# Patient Record
Sex: Male | Born: 1965 | Race: Black or African American | Hispanic: No | Marital: Single | Smoking: Current every day smoker
Health system: Southern US, Community
[De-identification: ages and names within clinical notes are randomized; demographics above are authoritative.]

## PROBLEM LIST (undated history)

## (undated) DIAGNOSIS — Z720 Tobacco use: Secondary | ICD-10-CM

## (undated) HISTORY — PX: LAPAROSCOPIC GASTROTOMY W/ REPAIR OF ULCER: SUR772

---

## 2005-05-07 ENCOUNTER — Emergency Department: Payer: Self-pay | Admitting: Emergency Medicine

## 2011-10-17 ENCOUNTER — Emergency Department: Payer: Self-pay | Admitting: Emergency Medicine

## 2012-05-25 ENCOUNTER — Emergency Department: Payer: Self-pay | Admitting: Emergency Medicine

## 2013-04-01 ENCOUNTER — Emergency Department: Payer: Self-pay | Admitting: Internal Medicine

## 2013-04-01 LAB — BASIC METABOLIC PANEL
Anion Gap: 4 — ABNORMAL LOW (ref 7–16)
BUN: 5 mg/dL — ABNORMAL LOW (ref 7–18)
Calcium, Total: 9.1 mg/dL (ref 8.5–10.1)
Chloride: 102 mmol/L (ref 98–107)
Co2: 28 mmol/L (ref 21–32)
EGFR (Non-African Amer.): >60
Glucose: 136 mg/dL — ABNORMAL HIGH (ref 65–99)
Sodium: 134 mmol/L — ABNORMAL LOW (ref 136–145)

## 2013-04-01 LAB — DRUG SCREEN, URINE
Barbiturates, Ur Screen: NEGATIVE (ref ?–200)
Benzodiazepine, Ur Scrn: NEGATIVE (ref ?–200)
Cannabinoid 50 Ng, Ur ~~LOC~~: NEGATIVE (ref ?–50)
Cocaine Metabolite,Ur ~~LOC~~: NEGATIVE (ref ?–300)
Methadone, Ur Screen: NEGATIVE (ref ?–300)
Phencyclidine (PCP) Ur S: NEGATIVE (ref ?–25)
Tricyclic, Ur Screen: NEGATIVE (ref ?–1000)

## 2013-04-01 LAB — CBC
HGB: 12.7 g/dL — ABNORMAL LOW (ref 13.0–18.0)
MCH: 22.8 pg — ABNORMAL LOW (ref 26.0–34.0)
RBC: 5.55 10*6/uL (ref 4.40–5.90)
RDW: 14.8 % — ABNORMAL HIGH (ref 11.5–14.5)
WBC: 5.9 10*3/uL (ref 3.8–10.6)

## 2013-04-01 LAB — CK TOTAL AND CKMB (NOT AT ARMC): CK-MB: 1.5 ng/mL (ref 0.5–3.6)

## 2013-04-01 LAB — TROPONIN I: Troponin-I: <0.02 ng/mL

## 2014-12-31 ENCOUNTER — Emergency Department: Payer: Self-pay | Admitting: Emergency Medicine

## 2014-12-31 LAB — BASIC METABOLIC PANEL
Anion Gap: 9 (ref 7–16)
BUN: 5 mg/dL — ABNORMAL LOW (ref 7–18)
CHLORIDE: 99 mmol/L (ref 98–107)
CO2: 26 mmol/L (ref 21–32)
Calcium, Total: 9.1 mg/dL (ref 8.5–10.1)
Creatinine: 0.69 mg/dL (ref 0.60–1.30)
EGFR (African American): >60
EGFR (Non-African Amer.): >60
Glucose: 104 mg/dL — ABNORMAL HIGH (ref 65–99)
OSMOLALITY: 266 (ref 275–301)
Potassium: 3.6 mmol/L (ref 3.5–5.1)
Sodium: 134 mmol/L — ABNORMAL LOW (ref 136–145)

## 2014-12-31 LAB — CBC
HCT: 36.3 % — ABNORMAL LOW (ref 40.0–52.0)
HGB: 11.4 g/dL — AB (ref 13.0–18.0)
MCH: 22.5 pg — AB (ref 26.0–34.0)
MCHC: 31.3 g/dL — AB (ref 32.0–36.0)
MCV: 72 fL — AB (ref 80–100)
Platelet: 235 10*3/uL (ref 150–440)
RBC: 5.04 10*6/uL (ref 4.40–5.90)
RDW: 14.8 % — ABNORMAL HIGH (ref 11.5–14.5)
WBC: 16.3 10*3/uL — ABNORMAL HIGH (ref 3.8–10.6)

## 2015-01-06 LAB — WOUND CULTURE

## 2015-01-17 ENCOUNTER — Emergency Department: Payer: Self-pay | Admitting: Emergency Medicine

## 2015-04-16 NOTE — Consult Note (Signed)
PATIENT NAME:  Martin Williams, Martin Williams MR#:  [id number] DATE OF BIRTH:  12-03-1966  DATE OF CONSULTATION:  12/31/2014  REFERRING PHYSICIAN:   CONSULTING PHYSICIAN:  Tenise Stetler A. Thedore Mins, MD  HISTORY OF PRESENT ILLNESS: Martin Williams is a very pleasant 49 year old gentleman who presented to the Emergency Room today for increasing discomfort in his left hemiscrotum. The patient reports, beginning 4 days ago, he noticed that his left hemiscrotum was increasing in size and was uncomfortable. One day ago, he noticed drainage occurring from his hemiscrotum that was cloudy in nature. This is associated with some increased discomfort. He denies fevers or chills. He denies any urinary symptoms, such as urinary frequency, urgency, or dysuria, now or in the preceding weeks. Because his drainage was getting worse this morning, associated with some mild blood tinge, he presented to the Emergency Room. In the Emergency Room, the patient was worked up with labs that showed leukocytosis of 16, a normal BMP, and he had an ultrasound performed of his scrotum, which showed an extratesticular fluid collection, as well as a separate fluid collection surrounding the left testicle. The patient denies at first any previous history of issues with his left testicle. On further questioning, he reports there may have been an episode one year ago, when he had a "boil," which he said resolved without intervention. He denies any recent trauma or changes in his medical health.   PAST MEDICAL HISTORY: None.  PAST SURGICAL HISTORY:  None.  SOCIAL HISTORY: The patient smokes 1/2 pack to a pack per day for the last 20 years, 3 drinks per day.  The patient is single, was brought in by his aunt.    FAMILY HISTORY:  denies cancer  REVIEW OF SYSTEMS:  CONSTITUTIONAL: Denies fevers, chills, fatigue, or weakness.  EYES: Denies blurred vision, double vision, or eye pain.  EARS, NOSE, THROAT: Denies tinnitus, ear pain, or hearing loss. RESPIRATORY: Denies  cough or wheeze.  CARDIOVASCULAR:  Denies any palpitations or edema. GASTROINTESTINAL:  Denies any vomiting or diarrhea.  GENITOURINARY: Denies dysuria or hematuria.  ENDOCRINE: Denies nocturia or thyroid problems.  HEMATOLOGIC AND LYMPHATIC: Denies easy bruising or bleeding MUSCULOSKELETAL: Denies pain in neck, back, shoulder, knees, hips, or arthritic   PHYSICAL EXAMINATION: VITAL SIGNS: Afebrile at 97.9 degrees, slightly tachycardic at 114, blood pressure 158/81, respirations 20 times per minute, saturating 95% on room air.  GENERAL:  No acute distress.  CHEST: Normal respiratory effort. ABDOMEN: Soft, nontender, nondistended.  BACK: No CVA tenderness.  GENITOURINARY: The patient is circumcised, glans is normal with a normal-appearing meatus. The scrotum shows tense swelling of the left hemiscrotum with induration of the inferior aspect of the scrotum. There is watery cloudy drainage from the left hemiscrotum that appears to be coming from a pinpoint location within the center of the indurated skin. There is significant tenderness to palpation of the entire hemiscrotum. The left testicle is difficult to palpate secondary to the swollen hemiscrotum and the tenderness to palpation. The right testicle can be felt within the right hemiscrotum, which has an overall normal appearance. There is a small divot in the skin of the right hemiscrotum, which the patient reports is old and is not sure of the etiology of. Perineum: The perineum is normal without tenderness. Inguinal lymphadenopathy is present on the left side and mildly on the right side. Digital rectal exam was performed. This revealed a nontender prostate measuring approximately 35 grams, without nodularity.  EXTREMITIES: Lower extremities: No edema, bilaterally equal in size.  LABORATORY DATA: I personally reviewed the patient's lab work, which was significant for leukocytosis of 16 and a BMP, which was normal.   I personally reviewed the  patient's ultrasound, which showed normal-appearing testicles. The left testicle had 2 cystic structures adjacent to it, one in the area near the left epididymis, the second surrounding the left inferior aspect of the testicle. Both of these structures had mild debris present, but neither seemed significantly complex. It is possible that this is one loculated structure. There is normal flow to both testicles. There is thickness of the scrotal skin seen on ultrasound.   PROCEDURE: The patient was counseled on the risks and benefits of incision of scrotum to drain possible abscess. Please see seperate procedure note   ASSESSMENT: Mr. Martin Williams is a 49 year old gentleman with 4 days of left hemiscrotal discomfort associated with drainage,  representing a scrotal abscess. Ultrasound was not impressive for a classic-appearing scrotal abscess. His physical exam with indurated skin and active drainage and significant tenderness to palpation suggested this; therefore, decision was made to perform an I and D at the bedside. This was done without difficulty. I counseled the patient that at this point it is crucial he have his wound packed at least twice a day and take a course of oral antibiotics, and that we follow up with him in the near future to ensure he is improving. He reports, after the procedure is done, he feels better. I counseled him, there is the potential that his infection will not improve, and there could be a need for a further procedure in the operating room. The patient reports he has an aunt who can perform the packing for him. Therefore, the aunt was instructed on how to pack the wound at the bedside today.  PLAN: We will plan to send the patient home as he appears non toxic and tolerated procedure well. He will go home with packing materials, as well as a prescription for Bactrim double strength to be taken for the next 3 weeks. As he has no insurance he elects for his aunt to perform packing for  him at home (as opposed to arraning home health). We will plan to send him for follow-up within a week to ensure his wound is improving and to follow up on wound cultures and urine culture obtained today. He was instructed to return to the Emergency Room if he were to begin to experience fevers or chills or increased drainage from the hemiscrotum.    ____________________________ Driscilla GrammesAbhay A. Thedore MinsSingh, MD aas:mw D: 12/31/2014 16:42:00 ET T: 12/31/2014 16:53:21 ET JOB#: [id number]  cc: Martin Williams A. Thedore MinsSingh, MD, <Dictator> Angelica PouABHAY A Mahala Rommel MD ELECTRONICALLY SIGNED 01/01/2015 18:07

## 2021-02-20 ENCOUNTER — Emergency Department (HOSPITAL_COMMUNITY)
Admission: EM | Admit: 2021-02-20 | Discharge: 2021-02-20 | Disposition: A | Discharge status: Home / Self Care | Payer: Self-pay | Attending: Emergency Medicine | Admitting: Emergency Medicine

## 2021-02-20 ENCOUNTER — Other Ambulatory Visit: Payer: Self-pay

## 2021-02-20 ENCOUNTER — Encounter (HOSPITAL_COMMUNITY): Payer: Self-pay

## 2021-02-20 ENCOUNTER — Emergency Department (HOSPITAL_COMMUNITY): Payer: Self-pay

## 2021-02-20 DIAGNOSIS — Y939 Activity, unspecified: Secondary | ICD-10-CM | POA: Insufficient documentation

## 2021-02-20 DIAGNOSIS — M7022 Olecranon bursitis, left elbow: Secondary | ICD-10-CM | POA: Insufficient documentation

## 2021-02-20 DIAGNOSIS — X58XXXA Exposure to other specified factors, initial encounter: Secondary | ICD-10-CM | POA: Insufficient documentation

## 2021-02-20 MED ORDER — NAPROXEN 250 MG PO TABS: 500.0000 mg | ORAL_TABLET | Freq: Once | ORAL | Status: DC

## 2021-02-20 MED ORDER — NAPROXEN 500 MG PO TABS: 500.0000 mg | ORAL_TABLET | Freq: Two times a day (BID) | ORAL | 0 refills | Status: AC

## 2021-02-20 NOTE — ED Triage Notes (Addendum)
Pt here today due to left elbow pain. Pt reports swelling to just elbow starting yesterday. No trauma.

## 2021-02-20 NOTE — Discharge Instructions (Signed)
Recommend following up with orthopedics next week.  Recommend taking anti-inflammatory as discussed for the next week.  If at any point you develop pain with range of motion, redness, fever, or other new concerning symptom, return to ER for reassessment.  Recommend resting, icing and protecting your elbow.

## 2021-02-20 NOTE — ED Provider Notes (Signed)
[facility] EMERGENCY DEPARTMENT Provider Note   CSN: [id number] Arrival date & time: 02/20/21  1016     History Chief Complaint  Patient presents with  . Arm Pain    Martin Williams is a 55 y.o. male.  Presents ER with concern for 1 day of elbow swelling.  Noted swelling on the tip of his left elbow.  Has some mild discomfort.  Has not noted any rashes, no fevers.  No trauma.  He does not work on his hands, no repetitive movements, no prior injuries.  Denies any medical problems.  HPI     History reviewed. No pertinent past medical history.  There are no problems to display for this patient.   History reviewed. No pertinent surgical history.     History reviewed. No pertinent family history.  Social History   Tobacco Use  . Smoking status: Never Smoker  . Smokeless tobacco: Never Used    Home Medications Prior to Admission medications   Medication Sig Start Date End Date Taking? Authorizing Provider  naproxen (NAPROSYN) 500 MG tablet Take 1 tablet (500 mg total) by mouth 2 (two) times daily for 7 days. 02/20/21 02/27/21 Yes Milagros Loll, MD    Allergies    Patient has no allergy information on record.  Review of Systems   Review of Systems  Constitutional: Negative for chills and fever.  HENT: Negative for ear pain and sore throat.   Eyes: Negative for pain and visual disturbance.  Respiratory: Negative for cough and shortness of breath.   Cardiovascular: Negative for chest pain and palpitations.  Gastrointestinal: Negative for abdominal pain and vomiting.  Genitourinary: Negative for dysuria and hematuria.  Musculoskeletal: Positive for arthralgias. Negative for back pain.  Skin: Negative for color change and rash.  Neurological: Negative for seizures and syncope.  All other systems reviewed and are negative.    Physical Exam Updated Vital Signs BP (!) 166/93 (BP Location: Right Arm)   Pulse 72   Temp 97.8 F (36.6 C)   Resp 18    SpO2 100%   Physical Exam Vitals and nursing note reviewed.  Constitutional:      Appearance: He is well-developed and well-nourished.  HENT:     Head: Normocephalic and atraumatic.  Eyes:     Conjunctiva/sclera: Conjunctivae normal.  Cardiovascular:     Rate and Rhythm: Normal rate.     Pulses: Normal pulses.  Pulmonary:     Effort: Pulmonary effort is normal. No respiratory distress.  Abdominal:     Palpations: Abdomen is soft.     Tenderness: There is no abdominal tenderness.  Musculoskeletal:        General: No edema.     Cervical back: Neck supple.     Comments: L elbow: swelling over the olecranon, no overlying erythema, normal joint ROM, no TTP  Skin:    General: Skin is warm and dry.  Neurological:     General: No focal deficit present.     Mental Status: He is alert.  Psychiatric:        Mood and Affect: Mood and affect normal.     ED Results / Procedures / Treatments   Labs (all labs ordered are listed, but only abnormal results are displayed) Labs Reviewed - No data to display  EKG None  Radiology DG Elbow Complete Left  Result Date: 02/20/2021 CLINICAL DATA:  Pain and swelling EXAM: LEFT ELBOW - COMPLETE 3+ VIEW COMPARISON:  None. FINDINGS: Frontal, lateral, and  bilateral oblique views were obtained. There is no fracture or dislocation. No appreciable elbow joint effusion. There is marked soft tissue swelling in the region of the olecranon bursa. There is a prominent olecranon process spur. No appreciable joint space narrowing or erosion. IMPRESSION: Marked soft tissue fullness/swelling in the olecranon bursa region. Suspect bursitis. There may well be fluid in this structure given the appearance. There is a prominent olecranon process spur. No fracture or dislocation. No appreciable joint space narrowing or joint effusion. No erosion. Electronically Signed   By: Bretta Bang III M.D.   On: 02/20/2021 11:09    Procedures Procedures =  Medications  Ordered in ED Medications - No data to display  ED Course  I have reviewed the triage vital signs and the nursing notes.  Pertinent labs & imaging results that were available during my care of the patient were reviewed by me and considered in my medical decision making (see chart for details).    MDM Rules/Calculators/A&P                          55 year old male presenting to ER with concern for left elbow pain and swelling.  On exam, consistent with olecranon bursitis.  He has no erythema, completely normal joint range of motion, relatively minimal pain, no fever.  Very low suspicion for septic bursitis at this time.  Recommend trial of conservative management with NSAIDs, rest and follow-up with Ortho.  Reviewed return precautions.  Final Clinical Impression(s) / ED Diagnoses Final diagnoses:  Olecranon bursitis of left elbow    Rx / DC Orders ED Discharge Orders         Ordered    naproxen (NAPROSYN) 500 MG tablet  2 times daily        02/20/21 1117           Milagros Loll, MD 02/21/21 [phone]

## 2021-03-10 ENCOUNTER — Other Ambulatory Visit: Payer: Self-pay

## 2021-03-10 ENCOUNTER — Emergency Department (HOSPITAL_COMMUNITY): Payer: Self-pay

## 2021-03-10 ENCOUNTER — Inpatient Hospital Stay (HOSPITAL_COMMUNITY)
Admission: EM | Admit: 2021-03-10 | Discharge: 2021-03-16 | DRG: 481 | Disposition: A | Discharge status: Home Health | Payer: Self-pay | Attending: Family Medicine | Admitting: Family Medicine

## 2021-03-10 DIAGNOSIS — E871 Hypo-osmolality and hyponatremia: Secondary | ICD-10-CM | POA: Diagnosis present

## 2021-03-10 DIAGNOSIS — S7291XA Unspecified fracture of right femur, initial encounter for closed fracture: Secondary | ICD-10-CM

## 2021-03-10 DIAGNOSIS — W010XXA Fall on same level from slipping, tripping and stumbling without subsequent striking against object, initial encounter: Secondary | ICD-10-CM | POA: Diagnosis present

## 2021-03-10 DIAGNOSIS — F17210 Nicotine dependence, cigarettes, uncomplicated: Secondary | ICD-10-CM | POA: Diagnosis present

## 2021-03-10 DIAGNOSIS — W19XXXA Unspecified fall, initial encounter: Secondary | ICD-10-CM

## 2021-03-10 DIAGNOSIS — D696 Thrombocytopenia, unspecified: Secondary | ICD-10-CM | POA: Diagnosis not present

## 2021-03-10 DIAGNOSIS — S72009A Fracture of unspecified part of neck of unspecified femur, initial encounter for closed fracture: Secondary | ICD-10-CM

## 2021-03-10 DIAGNOSIS — S7290XA Unspecified fracture of unspecified femur, initial encounter for closed fracture: Secondary | ICD-10-CM

## 2021-03-10 DIAGNOSIS — E559 Vitamin D deficiency, unspecified: Secondary | ICD-10-CM | POA: Diagnosis present

## 2021-03-10 DIAGNOSIS — D62 Acute posthemorrhagic anemia: Secondary | ICD-10-CM | POA: Diagnosis not present

## 2021-03-10 DIAGNOSIS — M80851A Other osteoporosis with current pathological fracture, right femur, initial encounter for fracture: Principal | ICD-10-CM | POA: Diagnosis present

## 2021-03-10 DIAGNOSIS — R9431 Abnormal electrocardiogram [ECG] [EKG]: Secondary | ICD-10-CM | POA: Diagnosis present

## 2021-03-10 DIAGNOSIS — E876 Hypokalemia: Secondary | ICD-10-CM | POA: Diagnosis not present

## 2021-03-10 DIAGNOSIS — Y92511 Restaurant or cafe as the place of occurrence of the external cause: Secondary | ICD-10-CM

## 2021-03-10 DIAGNOSIS — Z20822 Contact with and (suspected) exposure to covid-19: Secondary | ICD-10-CM | POA: Diagnosis present

## 2021-03-10 DIAGNOSIS — D509 Iron deficiency anemia, unspecified: Secondary | ICD-10-CM | POA: Diagnosis present

## 2021-03-10 DIAGNOSIS — Z23 Encounter for immunization: Secondary | ICD-10-CM

## 2021-03-10 HISTORY — DX: Tobacco use: Z72.0

## 2021-03-10 LAB — CBC WITH DIFFERENTIAL/PLATELET
Abs Immature Granulocytes: 0.04 10*3/uL (ref 0.00–0.07)
Basophils Absolute: 0.1 10*3/uL (ref 0.0–0.1)
Basophils Relative: 1 %
Eosinophils Absolute: 0.1 10*3/uL (ref 0.0–0.5)
Eosinophils Relative: 1 %
HCT: 36.6 % — ABNORMAL LOW (ref 39.0–52.0)
Hemoglobin: 11.5 g/dL — ABNORMAL LOW (ref 13.0–17.0)
Immature Granulocytes: 0 %
Lymphocytes Relative: 11 %
Lymphs Abs: 1.1 10*3/uL (ref 0.7–4.0)
MCH: 22.5 pg — ABNORMAL LOW (ref 26.0–34.0)
MCHC: 31.4 g/dL (ref 30.0–36.0)
MCV: 71.6 fL — ABNORMAL LOW (ref 80.0–100.0)
Monocytes Absolute: 0.8 10*3/uL (ref 0.1–1.0)
Monocytes Relative: 8 %
Neutro Abs: 7.7 10*3/uL (ref 1.7–7.7)
Neutrophils Relative %: 79 %
Platelets: 184 10*3/uL (ref 150–400)
RBC: 5.11 MIL/uL (ref 4.22–5.81)
RDW: 15.8 % — ABNORMAL HIGH (ref 11.5–15.5)
WBC: 9.7 10*3/uL (ref 4.0–10.5)
nRBC: 0 % (ref 0.0–0.2)

## 2021-03-10 LAB — COMPREHENSIVE METABOLIC PANEL
ALT: 15 U/L (ref 0–44)
AST: 34 U/L (ref 15–41)
Albumin: 4.1 g/dL (ref 3.5–5.0)
Alkaline Phosphatase: 61 U/L (ref 38–126)
Anion gap: 6 (ref 5–15)
BUN: 6 mg/dL (ref 6–20)
CO2: 23 mmol/L (ref 22–32)
Calcium: 8.9 mg/dL (ref 8.9–10.3)
Chloride: 102 mmol/L (ref 98–111)
Creatinine, Ser: 0.74 mg/dL (ref 0.61–1.24)
GFR, Estimated: >60 mL/min (ref >60)
Glucose, Bld: 109 mg/dL — ABNORMAL HIGH (ref 70–99)
Potassium: 3.6 mmol/L (ref 3.5–5.1)
Sodium: 131 mmol/L — ABNORMAL LOW (ref 135–145)
Total Bilirubin: 0.4 mg/dL (ref 0.3–1.2)
Total Protein: 7.3 g/dL (ref 6.5–8.1)

## 2021-03-10 MED ORDER — FENTANYL CITRATE (PF) 100 MCG/2ML IJ SOLN
50.0000 ug | Freq: Once | INTRAMUSCULAR | Status: AC
  Administered 2021-03-10: 50 ug via INTRAMUSCULAR
  Filled 2021-03-10: qty 2

## 2021-03-10 MED ORDER — HYDROCODONE-ACETAMINOPHEN 5-325 MG PO TABS: 1.0000 | ORAL_TABLET | Freq: Four times a day (QID) | ORAL | Status: DC | PRN

## 2021-03-10 MED ORDER — TETANUS-DIPHTH-ACELL PERTUSSIS 5-2.5-18.5 LF-MCG/0.5 IM SUSY
0.5000 mL | PREFILLED_SYRINGE | Freq: Once | INTRAMUSCULAR | Status: AC
  Administered 2021-03-10: 0.5 mL via INTRAMUSCULAR
  Filled 2021-03-10: qty 0.5

## 2021-03-10 MED ORDER — POLYETHYLENE GLYCOL 3350 17 G PO PACK: 17.0000 g | PACK | Freq: Two times a day (BID) | ORAL | Status: DC

## 2021-03-10 MED ORDER — ACETAMINOPHEN 325 MG PO TABS
650.0000 mg | ORAL_TABLET | Freq: Three times a day (TID) | ORAL | Status: DC
  Administered 2021-03-11: 650 mg via ORAL
  Filled 2021-03-10: qty 2

## 2021-03-10 MED ORDER — HYDROMORPHONE HCL 1 MG/ML IJ SOLN
1.0000 mg | Freq: Once | INTRAMUSCULAR | Status: AC
Start: 2021-03-10 — End: 2021-03-10
  Administered 2021-03-10: 1 mg via INTRAVENOUS
  Filled 2021-03-10: qty 1

## 2021-03-10 NOTE — ED Triage Notes (Signed)
Brought in by [facility] from work, pt had mechanical fall and has right hip pain and swelling and right shoulder pain and abrasion due to slippery floor.   Denies any use of blood thinners and denies hitting head. GCS15 pain scale 10/10.

## 2021-03-10 NOTE — ED Notes (Signed)
Patient transported to X-ray 

## 2021-03-10 NOTE — ED Notes (Signed)
Spoke with ortho tech and he states pt can get the bucks traction upstairs.

## 2021-03-10 NOTE — Progress Notes (Signed)
Ortho Note  55 yo male with right intertrochanteric femur fracture. Plan for surgery tomorrow AM. Recommend hospitalist admission. NPO after midnight.  Roby Lofts, MD [facility] [phone] (office) [email or web address]

## 2021-03-10 NOTE — H&P (Signed)
[facility] Admission History and Physical Service Pager: [phone]  Patient name: Martin Williams Medical record number: [id number] Date of birth: 1966/05/26 Age: 55 y.o. Gender: male  Primary Care Provider: Patient, No Pcp Per Consultants: ortho Code Status: full Preferred Emergency Contact: Deborah brown  Chief Complaint: R hip fx  Assessment and Plan: [name] is a 55 y.o. male presenting with R hip fx. PMH is significant for none.  R hip fx- s/p mechanical fall. Ortho requested medicine admission. S/p dilaudid and fentanyl in ED. Pt comfortable on encounter. R leg externally rotated. Tender to palpation. swelling present but no skin changes. Good distal circulation/sensation/toe movement. Also had shoulder/elbow injury with fall which was negative for fx on imaging.  - admit to [facility], attending Dr. Manson Passey - scheduled tylenol - PRN 1-2 oxycodone - NPO midnight - f/u ortho recs  - tentative surgery 3/27 - Tdap given - bucks traction - PT/OT s/p procedure - type & screen - mild microcytic anemia present  - repeat CBC s/p surgery.   - consider anemia studies  Tobacco use-  - nicotine patch daily  Hyponatremia- mild. Na 131 on admission and appears chronic.  - IV fluids x6hrs  QTc prolongation- ?500 on ECG - repeat am  PCP needs- - CCM referral placed  FEN/GI: NPO for surgery Prophylaxis: SCD, lovenox s/p procedure  Disposition: home, per ortho  History of Present Illness:  Martin Williams is a 55 y.o. male presenting with mechanical fall today resulting in R hip fracture. He is overall comfortable at rest now.  Occasional alcohol use. Current smoker. Denies illicit drugs.  Review Of Systems: Per HPI with the following additions:   Review of Systems   Patient Active Problem List   Diagnosis Date Noted  . Hip fracture (HCC) 03/10/2021   Past Medical History: No past medical history on file.  Past Surgical History: No past  surgical history on file.  Social History: Social History   Tobacco Use  . Smoking status: Never Smoker  . Smokeless tobacco: Never Used   Additional social history:   Please also refer to relevant sections of EMR.  Family History: No family history on file.  Allergies and Medications: No Known Allergies No current facility-administered medications on file prior to encounter.   No current outpatient medications on file prior to encounter.    Objective: BP 135/89 (BP Location: Right Arm)   Pulse 62   Temp 98 F (36.7 C) (Oral)   Resp 16   Ht 6\' 2"  (1.88 m)   Wt 79.4 kg   SpO2 100%   BMI 22.47 kg/m  Physical Exam Vitals and nursing note reviewed.  Constitutional:      General: He is not in acute distress.    Appearance: He is normal weight. He is not ill-appearing or toxic-appearing.  HENT:     Head: Normocephalic.  Cardiovascular:     Rate and Rhythm: Normal rate and regular rhythm.     Pulses: Normal pulses.     Heart sounds: Normal heart sounds.  Pulmonary:     Effort: Pulmonary effort is normal.     Breath sounds: Normal breath sounds.  Musculoskeletal:     Right hip: Deformity, tenderness and bony tenderness present. No lacerations. Normal range of motion. Decreased strength.     Left hip: Normal.     Right upper leg: Edema, deformity and tenderness present. No lacerations.     Right knee: Normal.  Left knee: Normal.     Right lower leg: Normal.     Left lower leg: Normal.     Right foot: Normal.     Left foot: Normal.  Neurological:     Mental Status: He is alert.      Labs and Imaging: CBC BMET  Recent Labs  Lab 03/10/21 2226  WBC 9.7  HGB 11.5*  HCT 36.6*  PLT 184   Recent Labs  Lab 03/10/21 2226  NA 131*  K 3.6  CL 102  CO2 23  BUN 6  CREATININE 0.74  GLUCOSE 109*  CALCIUM 8.9     DG Chest 1 View  Result Date: 03/10/2021 CLINICAL DATA:  Pain after fall. EXAM: CHEST  1 VIEW COMPARISON:  Chest radiograph April 01, 2013  FINDINGS: The heart size and mediastinal contours are within normal limits. Both lungs are clear. The visualized skeletal structures are unremarkable. IMPRESSION: No acute cardiopulmonary disease. Electronically Signed   By: Maudry Mayhew MD   On: 03/10/2021 22:01   DG Shoulder Right  Result Date: 03/10/2021 CLINICAL DATA:  Shoulder pain after fall EXAM: RIGHT SHOULDER - 2+ VIEW COMPARISON:  None. FINDINGS: There is no evidence of fracture or dislocation. There is no evidence of arthropathy or other focal bone abnormality. Soft tissues are unremarkable. IMPRESSION: No acute osseous abnormality. Electronically Signed   By: Maudry Mayhew MD   On: 03/10/2021 22:01   DG Hip Unilat W or Wo Pelvis 2-3 Views Right  Result Date: 03/10/2021 CLINICAL DATA:  Mechanical fall with right hip pain and swelling. EXAM: DG HIP (WITH OR WITHOUT PELVIS) 2-3V RIGHT COMPARISON:  None. FINDINGS: Displaced and impacted right inter trochanteric fracture. The femoral head appears seated well in the acetabulum. Soft tissue swelling about the hip. IMPRESSION: Displaced and impacted right intertrochanteric hip fracture. Electronically Signed   By: Maudry Mayhew MD   On: 03/10/2021 22:00    EKG: overall normal. QTc 500.  Leeroy Bock, DO 03/11/2021, 12:08 AM PGY-3, [place] FPTS Intern pager: [phone], text pages welcome

## 2021-03-10 NOTE — ED Provider Notes (Signed)
[facility] EMERGENCY DEPARTMENT Provider Note   CSN: [id number] Arrival date & time: 03/10/21  2046     History Chief Complaint  Patient presents with  . Hip Pain  . Fall    Martin Williams is a 55 y.o. male.  HPI      55yo male with no significant medical history presents with concern for right hip pain after a fall.  Works at [employer] and floor was just mopped and he was trying to tiptoe carefully across the floor and got to gate and slipped and fell landing on his right hip. Has hip pain and right shoulder pain.  No head trauma, LOC, neck pain, back pain, numbness/weakness.  No chest pain, dyspnea.  Pain is severe.  No past medical history on file.  There are no problems to display for this patient.   No past surgical history on file.     No family history on file.  Social History   Tobacco Use  . Smoking status: Never Smoker  . Smokeless tobacco: Never Used    Home Medications Prior to Admission medications   Not on File    Allergies    Patient has no known allergies.  Review of Systems   Review of Systems  Constitutional: Negative for fever.  HENT: Negative for sore throat.   Eyes: Negative for visual disturbance.  Respiratory: Negative for cough and shortness of breath.   Cardiovascular: Negative for chest pain.  Gastrointestinal: Negative for abdominal pain, nausea and vomiting.  Genitourinary: Negative for difficulty urinating.  Musculoskeletal: Positive for arthralgias. Negative for back pain, neck pain and neck stiffness.  Skin: Negative for rash.  Neurological: Negative for syncope and headaches.    Physical Exam Updated Vital Signs BP (!) 158/90 (BP Location: Right Arm)   Pulse 64   Temp 98 F (36.7 C) (Oral)   Resp 13   Ht 6\' 2"  (1.88 m)   Wt 79.4 kg   SpO2 98%   BMI 22.47 kg/m   Physical Exam Vitals and nursing note reviewed.  Constitutional:      General: He is not in acute distress.    Appearance: He is  well-developed. He is not diaphoretic.  HENT:     Head: Normocephalic and atraumatic.  Eyes:     Conjunctiva/sclera: Conjunctivae normal.  Cardiovascular:     Rate and Rhythm: Normal rate and regular rhythm.     Heart sounds: Normal heart sounds. No murmur heard. No friction rub. No gallop.   Pulmonary:     Effort: Pulmonary effort is normal. No respiratory distress.     Breath sounds: Normal breath sounds. No wheezing or rales.  Chest:     Chest wall: No tenderness.  Abdominal:     General: There is no distension.     Palpations: Abdomen is soft.     Tenderness: There is no abdominal tenderness. There is no guarding.  Musculoskeletal:     Cervical back: Normal range of motion.     Comments: No C/T/L spine tenderness Shortened, externally rotated right leg Normal sensation, movement, pulses distally Abrasion right posterior shoulder  Skin:    General: Skin is warm and dry.  Neurological:     Mental Status: He is alert and oriented to person, place, and time.     ED Results / Procedures / Treatments   Labs (all labs ordered are listed, but only abnormal results are displayed) Labs Reviewed  CBC WITH DIFFERENTIAL/PLATELET  COMPREHENSIVE METABOLIC PANEL  TYPE AND SCREEN   EKG None  Radiology DG Chest 1 View  Result Date: 03/10/2021 CLINICAL DATA:  Pain after fall. EXAM: CHEST  1 VIEW COMPARISON:  Chest radiograph April 01, 2013 FINDINGS: The heart size and mediastinal contours are within normal limits. Both lungs are clear. The visualized skeletal structures are unremarkable. IMPRESSION: No acute cardiopulmonary disease. Electronically Signed   By: Maudry Mayhew MD   On: 03/10/2021 22:01   DG Shoulder Right  Result Date: 03/10/2021 CLINICAL DATA:  Shoulder pain after fall EXAM: RIGHT SHOULDER - 2+ VIEW COMPARISON:  None. FINDINGS: There is no evidence of fracture or dislocation. There is no evidence of arthropathy or other focal bone abnormality. Soft tissues are  unremarkable. IMPRESSION: No acute osseous abnormality. Electronically Signed   By: Maudry Mayhew MD   On: 03/10/2021 22:01   DG Hip Unilat W or Wo Pelvis 2-3 Views Right  Result Date: 03/10/2021 CLINICAL DATA:  Mechanical fall with right hip pain and swelling. EXAM: DG HIP (WITH OR WITHOUT PELVIS) 2-3V RIGHT COMPARISON:  None. FINDINGS: Displaced and impacted right inter trochanteric fracture. The femoral head appears seated well in the acetabulum. Soft tissue swelling about the hip. IMPRESSION: Displaced and impacted right intertrochanteric hip fracture. Electronically Signed   By: Maudry Mayhew MD   On: 03/10/2021 22:00    Procedures Procedures   Medications Ordered in ED Medications  HYDROmorphone (DILAUDID) injection 1 mg (has no administration in time range)  fentaNYL (SUBLIMAZE) injection 50 mcg (50 mcg Intramuscular Given 03/10/21 2120)    ED Course  I have reviewed the triage vital signs and the nursing notes.  Pertinent labs & imaging results that were available during my care of the patient were reviewed by me and considered in my medical decision making (see chart for details).    MDM Rules/Calculators/A&P                           55yo male with no significant medical history presents with concern for right hip pain after a fall.  Also with shoulder painn, XR WNL. No sign of intracranial, cervical, thoracic, lumbar, chest or abdominal injury by history and exma.  XR shows intertrochanteric hip fracture. NV intact.  Discussed with Dr. Jena Gauss or Orthopedics and admitted for further care.   Final Clinical Impression(s) / ED Diagnoses Final diagnoses:  Hip fx [facility])    Rx / DC Orders ED Discharge Orders    None       Alvira Monday, MD 03/12/21 [phone]

## 2021-03-11 ENCOUNTER — Inpatient Hospital Stay (HOSPITAL_COMMUNITY): Payer: Self-pay | Admitting: Anesthesiology

## 2021-03-11 ENCOUNTER — Inpatient Hospital Stay (HOSPITAL_COMMUNITY): Payer: Self-pay

## 2021-03-11 ENCOUNTER — Encounter (HOSPITAL_COMMUNITY): Payer: Self-pay | Admitting: Student in an Organized Health Care Education/Training Program

## 2021-03-11 ENCOUNTER — Encounter (HOSPITAL_COMMUNITY)
Admission: EM | Disposition: A | Discharge status: Home Health | Payer: Self-pay | Source: Home / Self Care | Attending: Family Medicine

## 2021-03-11 DIAGNOSIS — S72001A Fracture of unspecified part of neck of right femur, initial encounter for closed fracture: Secondary | ICD-10-CM

## 2021-03-11 DIAGNOSIS — W19XXXA Unspecified fall, initial encounter: Secondary | ICD-10-CM

## 2021-03-11 HISTORY — PX: INTRAMEDULLARY (IM) NAIL INTERTROCHANTERIC: SHX5875

## 2021-03-11 LAB — CBC
HCT: 27.5 % — ABNORMAL LOW (ref 39.0–52.0)
Hemoglobin: 8.8 g/dL — ABNORMAL LOW (ref 13.0–17.0)
MCH: 22.9 pg — ABNORMAL LOW (ref 26.0–34.0)
MCHC: 32 g/dL (ref 30.0–36.0)
MCV: 71.6 fL — ABNORMAL LOW (ref 80.0–100.0)
Platelets: 162 10*3/uL (ref 150–400)
RBC: 3.84 MIL/uL — ABNORMAL LOW (ref 4.22–5.81)
RDW: 15.4 % (ref 11.5–15.5)
WBC: 10 10*3/uL (ref 4.0–10.5)
nRBC: 0 % (ref 0.0–0.2)

## 2021-03-11 LAB — BASIC METABOLIC PANEL
Anion gap: 8 (ref 5–15)
BUN: 5 mg/dL — ABNORMAL LOW (ref 6–20)
CO2: 24 mmol/L (ref 22–32)
Calcium: 8.4 mg/dL — ABNORMAL LOW (ref 8.9–10.3)
Chloride: 100 mmol/L (ref 98–111)
Creatinine, Ser: 0.71 mg/dL (ref 0.61–1.24)
GFR, Estimated: >60 mL/min (ref >60)
Glucose, Bld: 114 mg/dL — ABNORMAL HIGH (ref 70–99)
Potassium: 3.5 mmol/L (ref 3.5–5.1)
Sodium: 132 mmol/L — ABNORMAL LOW (ref 135–145)

## 2021-03-11 LAB — SURGICAL PCR SCREEN
MRSA, PCR: NEGATIVE
Staphylococcus aureus: NEGATIVE

## 2021-03-11 LAB — ABO/RH: ABO/RH(D): O POS

## 2021-03-11 LAB — RESP PANEL BY RT-PCR (FLU A&B, COVID) ARPGX2
Influenza A by PCR: NEGATIVE
Influenza B by PCR: NEGATIVE
SARS Coronavirus 2 by RT PCR: NEGATIVE

## 2021-03-11 LAB — VITAMIN D 25 HYDROXY (VIT D DEFICIENCY, FRACTURES): Vit D, 25-Hydroxy: 8.52 ng/mL — ABNORMAL LOW (ref 30–100)

## 2021-03-11 LAB — HIV ANTIBODY (ROUTINE TESTING W REFLEX): HIV Screen 4th Generation wRfx: NONREACTIVE

## 2021-03-11 SURGERY — FIXATION, FRACTURE, INTERTROCHANTERIC, WITH INTRAMEDULLARY ROD
Anesthesia: General | Laterality: Right

## 2021-03-11 MED ORDER — 0.9 % SODIUM CHLORIDE (POUR BTL) OPTIME
TOPICAL | Status: DC | PRN
  Administered 2021-03-11: 500 mL

## 2021-03-11 MED ORDER — PHENYLEPHRINE HCL-NACL 10-0.9 MG/250ML-% IV SOLN
INTRAVENOUS | Status: DC | PRN
  Administered 2021-03-11: 75 ug/min via INTRAVENOUS

## 2021-03-11 MED ORDER — MIDAZOLAM HCL 2 MG/2ML IJ SOLN
INTRAMUSCULAR | Status: AC
  Filled 2021-03-11: qty 2

## 2021-03-11 MED ORDER — FENTANYL CITRATE (PF) 100 MCG/2ML IJ SOLN
25.0000 ug | INTRAMUSCULAR | Status: DC | PRN
Start: 2021-03-11 — End: 2021-03-11
  Administered 2021-03-11: 50 ug via INTRAVENOUS

## 2021-03-11 MED ORDER — FENTANYL CITRATE (PF) 250 MCG/5ML IJ SOLN
INTRAMUSCULAR | Status: DC | PRN
  Administered 2021-03-11 (×3): 50 ug via INTRAVENOUS

## 2021-03-11 MED ORDER — SUGAMMADEX SODIUM 200 MG/2ML IV SOLN
INTRAVENOUS | Status: DC | PRN
  Administered 2021-03-11: 200 mg via INTRAVENOUS
  Administered 2021-03-11: 20 mg via INTRAVENOUS

## 2021-03-11 MED ORDER — ONDANSETRON HCL 4 MG/2ML IJ SOLN: 4.0000 mg | Freq: Once | INTRAMUSCULAR | Status: DC | PRN

## 2021-03-11 MED ORDER — FENTANYL CITRATE (PF) 100 MCG/2ML IJ SOLN
INTRAMUSCULAR | Status: AC
  Filled 2021-03-11: qty 2

## 2021-03-11 MED ORDER — ORAL CARE MOUTH RINSE: 15.0000 mL | Freq: Once | OROMUCOSAL | Status: AC

## 2021-03-11 MED ORDER — POLYETHYLENE GLYCOL 3350 17 G PO PACK
17.0000 g | PACK | Freq: Every day | ORAL | Status: DC | PRN
  Administered 2021-03-15: 17 g via ORAL
  Filled 2021-03-11 (×2): qty 1

## 2021-03-11 MED ORDER — CHLORHEXIDINE GLUCONATE 0.12 % MT SOLN
15.0000 mL | Freq: Once | OROMUCOSAL | Status: AC
  Administered 2021-03-11: 15 mL via OROMUCOSAL
  Filled 2021-03-11: qty 15

## 2021-03-11 MED ORDER — OXYCODONE HCL 5 MG PO TABS: 5.0000 mg | ORAL_TABLET | Freq: Once | ORAL | Status: DC | PRN

## 2021-03-11 MED ORDER — OXYCODONE HCL 5 MG/5ML PO SOLN: 5.0000 mg | Freq: Once | ORAL | Status: DC | PRN

## 2021-03-11 MED ORDER — HYDROCODONE-ACETAMINOPHEN 5-325 MG PO TABS
1.0000 | ORAL_TABLET | ORAL | Status: DC | PRN
Start: 2021-03-11 — End: 2021-03-16
  Administered 2021-03-14: 1 via ORAL
  Administered 2021-03-15: 2 via ORAL
  Administered 2021-03-16: 1 via ORAL
  Filled 2021-03-11: qty 1
  Filled 2021-03-11: qty 2
  Filled 2021-03-11: qty 1

## 2021-03-11 MED ORDER — ONDANSETRON HCL 4 MG/2ML IJ SOLN
INTRAMUSCULAR | Status: DC | PRN
  Administered 2021-03-11: 4 mg via INTRAVENOUS

## 2021-03-11 MED ORDER — METOCLOPRAMIDE HCL 5 MG/ML IJ SOLN: 5.0000 mg | Freq: Three times a day (TID) | INTRAMUSCULAR | Status: DC | PRN

## 2021-03-11 MED ORDER — ONDANSETRON HCL 4 MG/2ML IJ SOLN: 4.0000 mg | Freq: Four times a day (QID) | INTRAMUSCULAR | Status: DC | PRN

## 2021-03-11 MED ORDER — POTASSIUM CHLORIDE IN NACL 20-0.9 MEQ/L-% IV SOLN
INTRAVENOUS | Status: DC
  Filled 2021-03-11 (×2): qty 1000

## 2021-03-11 MED ORDER — NICOTINE 14 MG/24HR TD PT24
14.0000 mg | MEDICATED_PATCH | Freq: Every day | TRANSDERMAL | Status: DC
  Administered 2021-03-11 – 2021-03-16 (×6): 14 mg via TRANSDERMAL
  Filled 2021-03-11 (×6): qty 1

## 2021-03-11 MED ORDER — PROPOFOL 10 MG/ML IV BOLUS
INTRAVENOUS | Status: DC | PRN
  Administered 2021-03-11: 120 mg via INTRAVENOUS

## 2021-03-11 MED ORDER — ACETAMINOPHEN 325 MG PO TABS
325.0000 mg | ORAL_TABLET | Freq: Four times a day (QID) | ORAL | Status: DC | PRN
  Administered 2021-03-12: 650 mg via ORAL
  Filled 2021-03-11: qty 2

## 2021-03-11 MED ORDER — ROCURONIUM BROMIDE 10 MG/ML (PF) SYRINGE
PREFILLED_SYRINGE | INTRAVENOUS | Status: DC | PRN
  Administered 2021-03-11: 20 mg via INTRAVENOUS
  Administered 2021-03-11: 50 mg via INTRAVENOUS

## 2021-03-11 MED ORDER — ENOXAPARIN SODIUM 40 MG/0.4ML ~~LOC~~ SOLN: 40.0000 mg | SUBCUTANEOUS | Status: DC

## 2021-03-11 MED ORDER — FENTANYL CITRATE (PF) 250 MCG/5ML IJ SOLN
INTRAMUSCULAR | Status: AC
  Filled 2021-03-11: qty 5

## 2021-03-11 MED ORDER — DOCUSATE SODIUM 100 MG PO CAPS
100.0000 mg | ORAL_CAPSULE | Freq: Two times a day (BID) | ORAL | Status: DC
  Administered 2021-03-11 – 2021-03-15 (×7): 100 mg via ORAL
  Filled 2021-03-11 (×10): qty 1

## 2021-03-11 MED ORDER — CEFAZOLIN SODIUM-DEXTROSE 2-4 GM/100ML-% IV SOLN
2.0000 g | Freq: Three times a day (TID) | INTRAVENOUS | Status: AC
  Administered 2021-03-11 – 2021-03-12 (×3): 2 g via INTRAVENOUS
  Filled 2021-03-11 (×3): qty 100

## 2021-03-11 MED ORDER — METHOCARBAMOL 1000 MG/10ML IJ SOLN
500.0000 mg | Freq: Four times a day (QID) | INTRAVENOUS | Status: DC | PRN
  Filled 2021-03-11: qty 5

## 2021-03-11 MED ORDER — LACTATED RINGERS IV SOLN: INTRAVENOUS | Status: DC

## 2021-03-11 MED ORDER — CEFAZOLIN SODIUM-DEXTROSE 2-4 GM/100ML-% IV SOLN
INTRAVENOUS | Status: AC
  Filled 2021-03-11: qty 100

## 2021-03-11 MED ORDER — HYDROCODONE-ACETAMINOPHEN 7.5-325 MG PO TABS
1.0000 | ORAL_TABLET | ORAL | Status: DC | PRN
  Administered 2021-03-12: 2 via ORAL
  Administered 2021-03-12: 1 via ORAL
  Administered 2021-03-13 – 2021-03-15 (×4): 2 via ORAL
  Filled 2021-03-11: qty 2
  Filled 2021-03-11: qty 1
  Filled 2021-03-11 (×4): qty 2

## 2021-03-11 MED ORDER — CEFAZOLIN SODIUM-DEXTROSE 2-3 GM-%(50ML) IV SOLR
INTRAVENOUS | Status: DC | PRN
Start: 2021-03-11 — End: 2021-03-11
  Administered 2021-03-11: 2 g via INTRAVENOUS

## 2021-03-11 MED ORDER — PHENYLEPHRINE 40 MCG/ML (10ML) SYRINGE FOR IV PUSH (FOR BLOOD PRESSURE SUPPORT)
PREFILLED_SYRINGE | INTRAVENOUS | Status: DC | PRN
  Administered 2021-03-11 (×2): 80 ug via INTRAVENOUS

## 2021-03-11 MED ORDER — METHOCARBAMOL 500 MG PO TABS
500.0000 mg | ORAL_TABLET | Freq: Four times a day (QID) | ORAL | Status: DC | PRN
  Administered 2021-03-12 – 2021-03-15 (×3): 500 mg via ORAL
  Filled 2021-03-11 (×3): qty 1

## 2021-03-11 MED ORDER — METOCLOPRAMIDE HCL 5 MG PO TABS: 5.0000 mg | ORAL_TABLET | Freq: Three times a day (TID) | ORAL | Status: DC | PRN

## 2021-03-11 MED ORDER — ONDANSETRON HCL 4 MG/2ML IJ SOLN
INTRAMUSCULAR | Status: AC
  Filled 2021-03-11: qty 2

## 2021-03-11 MED ORDER — LIDOCAINE 2% (20 MG/ML) 5 ML SYRINGE
INTRAMUSCULAR | Status: DC | PRN
  Administered 2021-03-11: 50 mg via INTRAVENOUS

## 2021-03-11 MED ORDER — MORPHINE SULFATE (PF) 2 MG/ML IV SOLN
0.5000 mg | INTRAVENOUS | Status: DC | PRN
  Administered 2021-03-12: 1 mg via INTRAVENOUS
  Filled 2021-03-11: qty 1

## 2021-03-11 MED ORDER — ONDANSETRON HCL 4 MG PO TABS: 4.0000 mg | ORAL_TABLET | Freq: Four times a day (QID) | ORAL | Status: DC | PRN

## 2021-03-11 MED ORDER — VANCOMYCIN HCL 1000 MG IV SOLR
INTRAVENOUS | Status: AC
  Filled 2021-03-11: qty 1000

## 2021-03-11 MED ORDER — MIDAZOLAM HCL 5 MG/5ML IJ SOLN
INTRAMUSCULAR | Status: DC | PRN
  Administered 2021-03-11: 1 mg via INTRAVENOUS

## 2021-03-11 SURGICAL SUPPLY — 50 items
BIT DRILL INTERTAN LAG SCREW (BIT) ×2 IMPLANT
BIT DRILL SHORT 4.0 (BIT) ×2 IMPLANT
BRUSH SCRUB EZ PLAIN DRY (MISCELLANEOUS) ×2 IMPLANT
CHLORAPREP W/TINT 26 (MISCELLANEOUS) ×2 IMPLANT
COVER PERINEAL POST (MISCELLANEOUS) ×2 IMPLANT
COVER SURGICAL LIGHT HANDLE (MISCELLANEOUS) ×2 IMPLANT
COVER WAND RF STERILE (DRAPES) IMPLANT
DERMABOND ADVANCED (GAUZE/BANDAGES/DRESSINGS) ×1
DERMABOND ADVANCED .7 DNX12 (GAUZE/BANDAGES/DRESSINGS) ×1 IMPLANT
DRAPE C-ARM 35X43 STRL (DRAPES) ×2 IMPLANT
DRAPE IMP U-DRAPE 54X76 (DRAPES) ×4 IMPLANT
DRAPE INCISE IOBAN 66X45 STRL (DRAPES) ×2 IMPLANT
DRAPE STERI IOBAN 125X83 (DRAPES) ×2 IMPLANT
DRAPE SURG 17X23 STRL (DRAPES) ×4 IMPLANT
DRAPE U-SHAPE 47X51 STRL (DRAPES) ×2 IMPLANT
DRESSING MEPILEX FLEX 4X4 (GAUZE/BANDAGES/DRESSINGS) ×1 IMPLANT
DRILL BIT SHORT 4.0 (BIT) ×2
DRSG MEPILEX BORDER 4X4 (GAUZE/BANDAGES/DRESSINGS) ×6 IMPLANT
DRSG MEPILEX BORDER 4X8 (GAUZE/BANDAGES/DRESSINGS) IMPLANT
DRSG MEPILEX FLEX 4X4 (GAUZE/BANDAGES/DRESSINGS) ×2
ELECT REM PT RETURN 9FT ADLT (ELECTROSURGICAL) ×2
ELECTRODE REM PT RTRN 9FT ADLT (ELECTROSURGICAL) ×1 IMPLANT
GLOVE BIO SURGEON STRL SZ 6.5 (GLOVE) ×6 IMPLANT
GLOVE BIO SURGEON STRL SZ7.5 (GLOVE) ×8 IMPLANT
GLOVE BIOGEL PI IND STRL 7.5 (GLOVE) ×1 IMPLANT
GLOVE BIOGEL PI INDICATOR 7.5 (GLOVE) ×1
GLOVE SURG UNDER POLY LF SZ6.5 (GLOVE) ×2 IMPLANT
GOWN STRL REUS W/ TWL LRG LVL3 (GOWN DISPOSABLE) ×1 IMPLANT
GOWN STRL REUS W/TWL LRG LVL3 (GOWN DISPOSABLE) ×1
GUIDE PIN 3.2X343 (PIN) ×2
GUIDE PIN 3.2X343MM (PIN) ×2
GUIDE ROD 3.0 (MISCELLANEOUS) ×2
KIT BASIN OR (CUSTOM PROCEDURE TRAY) ×2 IMPLANT
KIT TURNOVER KIT B (KITS) ×2 IMPLANT
MANIFOLD NEPTUNE II (INSTRUMENTS) ×2 IMPLANT
NAIL LOCK CANN 10X420 130D RT (Nail) ×2 IMPLANT
NS IRRIG 1000ML POUR BTL (IV SOLUTION) ×2 IMPLANT
PACK GENERAL/GYN (CUSTOM PROCEDURE TRAY) ×2 IMPLANT
PAD ARMBOARD 7.5X6 YLW CONV (MISCELLANEOUS) ×4 IMPLANT
PIN GUIDE 3.2X343MM (PIN) ×2 IMPLANT
ROD GUIDE 3.0 (MISCELLANEOUS) ×1 IMPLANT
SCREW LAG COMPR KIT 90/85 (Screw) ×2 IMPLANT
SCREW TRIGEN LOW PROF 5.0X45 (Screw) ×4 IMPLANT
SUT MNCRL AB 3-0 PS2 18 (SUTURE) ×2 IMPLANT
SUT VIC AB 0 CT1 27 (SUTURE)
SUT VIC AB 0 CT1 27XBRD ANBCTR (SUTURE) IMPLANT
SUT VIC AB 2-0 CT1 27 (SUTURE) ×2
SUT VIC AB 2-0 CT1 TAPERPNT 27 (SUTURE) ×2 IMPLANT
TOWEL GREEN STERILE (TOWEL DISPOSABLE) ×4 IMPLANT
WATER STERILE IRR 1000ML POUR (IV SOLUTION) ×2 IMPLANT

## 2021-03-11 NOTE — Anesthesia Postprocedure Evaluation (Signed)
Anesthesia Post Note  Patient: Martin Williams  Procedure(s) Performed: INTRAMEDULLARY (IM) NAIL INTERTROCHANTRIC (Right )     Patient location during evaluation: PACU Anesthesia Type: General Level of consciousness: awake and alert Pain management: pain level controlled Vital Signs Assessment: post-procedure vital signs reviewed and stable Respiratory status: spontaneous breathing, nonlabored ventilation, respiratory function stable and patient connected to nasal cannula oxygen Cardiovascular status: blood pressure returned to baseline and stable Postop Assessment: no apparent nausea or vomiting Anesthetic complications: no   No complications documented.  Last Vitals:  Vitals:   03/11/21 1447 03/11/21 2000  BP: 116/73 109/68  Pulse: 93 97  Resp: 16 16  Temp: 37.1 C 36.9 C  SpO2: 97% 98%    Last Pain:  Vitals:   03/11/21 2000  TempSrc: Oral  PainSc:                  Keyanni Whittinghill COKER

## 2021-03-11 NOTE — H&P (View-Only) (Signed)
Orthopaedic Trauma Service (OTS) Consult  ° °Patient ID: °Martin Williams °MRN: [id number] °DOB/AGE: 06/02/1966 54 y.o. ° °Reason for Consult:Right hip fracture °Referring Physician: Dr. Erin Schlossman, MD [facility] ° °HPI: Martin Williams is an 54 y.o. male who is being seen in consultation at the crest of Dr. Schlossman for evaluation of right hip fracture.  Patient works at [employer] where the floor was just a recently mopped.  He slipped and fell and landed on his right side.  He had immediate pain and deformity and inability bear weight.  He was brought to the emergency room and x-ray showed a right intertrochanteric/subtrochanteric femur fracture.  I was consulted for evaluation and treatment.  He was placed in Buck's traction overnight. ° °Patient was seen and evaluated on 5 N.  Patient lives here in [place] with his niece.  He lives on the ground story.  No stairs to get in.  He works as [employer] as noted above.  He smokes about half a pack of cigarettes a day.  He does not have any medical problems but he does not see her doctor regularly.  Denies any illicit drug use.  He is otherwise active and ambulates without assist device.  Recently moved here to [place] he lived in [place] for 5 to 6 years previously.  He denies any other injuries in his left lower extremity or bilateral upper extremities.  Denies any numbness or tingling. ° °No past medical history on file. ° °No past surgical history on file. ° °No family history on file. ° °Social History:  reports that he has never smoked. He has never used smokeless tobacco. No history on file for alcohol use and drug use. ° °Allergies: No Known Allergies ° °Medications:  °No current facility-administered medications on file prior to encounter.  ° °No current outpatient medications on file prior to encounter.  ° ° °ROS: Constitutional: No fever or chills °Vision: No changes in vision °ENT: No difficulty swallowing °CV: No chest pain °Pulm: No SOB or  wheezing °GI: No nausea or vomiting °GU: No urgency or inability to hold urine °Skin: No poor wound healing °Neurologic: No numbness or tingling °Psychiatric: No depression or anxiety °Heme: No bruising °Allergic: No reaction to medications or food ° ° °Exam: °Blood pressure 132/82, pulse 70, temperature 98.5 °F (36.9 °C), temperature source Oral, resp. rate 18, height 6' 2" (1.88 m), weight 79.4 kg, SpO2 100 %. °General: No acute distress °Orientation: Awake alert and oriented x3 °Mood and Affect: Cooperative and pleasant °Gait: Unable to assess due to his fracture °Coordination and balance: Within normal limits ° °Right lower extremity: Obvious deformity to the right hip.  Leg is externally rotated and in Buck's traction.  Compartments are soft and compressible.  Patient has active dorsiflexion plantarflexion of his toes and ankle.  He is warm well perfused foot.  Unable to assess further instability exam motor function secondary to his fracture.  No lymphadenopathy.  Reflexes within normal limits. ° °Left lower extremity: Skin without lesions. No tenderness to palpation. Full painless ROM, full strength in each muscle groups without evidence of instability. ° ° °Medical Decision Making: °Data: °Imaging: X-rays of the right hip show a comminuted proximal femur fracture with significant displacement and varus angulation. ° °Labs:  °Results for orders placed or performed during the hospital encounter of 03/10/21 (from the past 24 hour(s))  °CBC with Differential     Status: Abnormal  ° Collection Time: 03/10/21 10:26 PM  °Result Value   Ref Range  ° WBC 9.7 4.0 - 10.5 K/uL  ° RBC 5.11 4.22 - 5.81 MIL/uL  ° Hemoglobin 11.5 (L) 13.0 - 17.0 g/dL  ° HCT 36.6 (L) 39.0 - 52.0 %  ° MCV 71.6 (L) 80.0 - 100.0 fL  ° MCH 22.5 (L) 26.0 - 34.0 pg  ° MCHC 31.4 30.0 - 36.0 g/dL  ° RDW 15.8 (H) 11.5 - 15.5 %  ° Platelets 184 150 - 400 K/uL  ° nRBC 0.0 0.0 - 0.2 %  ° Neutrophils Relative % 79 %  ° Neutro Abs 7.7 1.7 - 7.7 K/uL  °  Lymphocytes Relative 11 %  ° Lymphs Abs 1.1 0.7 - 4.0 K/uL  ° Monocytes Relative 8 %  ° Monocytes Absolute 0.8 0.1 - 1.0 K/uL  ° Eosinophils Relative 1 %  ° Eosinophils Absolute 0.1 0.0 - 0.5 K/uL  ° Basophils Relative 1 %  ° Basophils Absolute 0.1 0.0 - 0.1 K/uL  ° Immature Granulocytes 0 %  ° Abs Immature Granulocytes 0.04 0.00 - 0.07 K/uL  °Comprehensive metabolic panel     Status: Abnormal  ° Collection Time: 03/10/21 10:26 PM  °Result Value Ref Range  ° Sodium 131 (L) 135 - 145 mmol/L  ° Potassium 3.6 3.5 - 5.1 mmol/L  ° Chloride 102 98 - 111 mmol/L  ° CO2 23 22 - 32 mmol/L  ° Glucose, Bld 109 (H) 70 - 99 mg/dL  ° BUN 6 6 - 20 mg/dL  ° Creatinine, Ser 0.74 0.61 - 1.24 mg/dL  ° Calcium 8.9 8.9 - 10.3 mg/dL  ° Total Protein 7.3 6.5 - 8.1 g/dL  ° Albumin 4.1 3.5 - 5.0 g/dL  ° AST 34 15 - 41 U/L  ° ALT 15 0 - 44 U/L  ° Alkaline Phosphatase 61 38 - 126 U/L  ° Total Bilirubin 0.4 0.3 - 1.2 mg/dL  ° GFR, Estimated >60 >60 mL/min  ° Anion gap 6 5 - 15  °Type and screen [facility]     Status: None  ° Collection Time: 03/10/21 10:32 PM  °Result Value Ref Range  ° ABO/RH(D) O POS   ° Antibody Screen NEG   ° Sample Expiration    °  03/13/2021,2359 °Performed at [facility], [address] [place], [place] [address] °  °Resp Panel by RT-PCR (Flu A&B, Covid) Nasopharyngeal Swab     Status: None  ° Collection Time: 03/10/21 10:57 PM  ° Specimen: Nasopharyngeal Swab; Nasopharyngeal(NP) swabs in vial transport medium  °Result Value Ref Range  ° SARS Coronavirus 2 by RT PCR NEGATIVE NEGATIVE  ° Influenza A by PCR NEGATIVE NEGATIVE  ° Influenza B by PCR NEGATIVE NEGATIVE  °ABO/Rh     Status: None  ° Collection Time: 03/10/21 11:00 PM  °Result Value Ref Range  ° ABO/RH(D)    °  O POS °Performed at [facility], [address] [place],  [address] °  °Surgical pcr screen     Status: None  ° Collection Time: 03/11/21  1:34 AM  ° Specimen: Nasal Mucosa; Nasal Swab  °Result Value Ref Range  °  MRSA, PCR NEGATIVE NEGATIVE  ° Staphylococcus aureus NEGATIVE NEGATIVE  °HIV Antibody (routine testing w rflx)     Status: None  ° Collection Time: 03/11/21  1:36 AM  °Result Value Ref Range  ° HIV Screen 4th Generation wRfx Non Reactive Non Reactive  ° ° °Imaging or Labs ordered: None ° °Medical history and chart   was reviewed and case discussed with medical provider. ° °Assessment/Plan: °54-year-old male with a right intertrochanteric/subtrochanteric femur fracture. ° °Due to the unstable nature of his injury I recommended intramedullary nailing of his a right femur fracture.  Risks and benefits were discussed with the patient.  Risks included but not limited to bleeding, infection, malunion, nonunion, hardware failure, hardware irritation, nerve and blood vessel injury, DVT, even possibility anesthetic complications.  The patient agreed to proceed with surgery and consent was obtained. ° °Thoams Siefert P. Luther Newhouse, MD °[facility] °[phone] (office) °[email or web address] ° °

## 2021-03-11 NOTE — Anesthesia Preprocedure Evaluation (Signed)
Anesthesia Evaluation  Patient identified by MRN, date of birth, ID band Patient awake    Reviewed: Allergy & Precautions, NPO status , Patient's Chart, lab work & pertinent test results  Airway Mallampati: II  TM Distance: >3 FB Neck ROM: Full    Dental  (+) Teeth Intact, Poor Dentition, Dental Advisory Given   Pulmonary Current Smoker and Patient abstained from smoking.,    breath sounds clear to auscultation       Cardiovascular  Rhythm:Regular Rate:Normal     Neuro/Psych    GI/Hepatic   Endo/Other    Renal/GU      Musculoskeletal   Abdominal   Peds  Hematology   Anesthesia Other Findings   Reproductive/Obstetrics                             Anesthesia Physical Anesthesia Plan  ASA: II  Anesthesia Plan: General   Post-op Pain Management:    Induction: Intravenous  PONV Risk Score and Plan: Ondansetron and Dexamethasone  Airway Management Planned: Oral ETT  Additional Equipment:   Intra-op Plan:   Post-operative Plan: Extubation in OR  Informed Consent: I have reviewed the patients History and Physical, chart, labs and discussed the procedure including the risks, benefits and alternatives for the proposed anesthesia with the patient or authorized representative who has indicated his/her understanding and acceptance.     Dental advisory given  Plan Discussed with: CRNA and Anesthesiologist  Anesthesia Plan Comments:         Anesthesia Quick Evaluation

## 2021-03-11 NOTE — Op Note (Signed)
Orthopaedic Surgery Operative Note (CSN: [id number] ) Date of Surgery: 03/11/2021  Admit Date: 03/10/2021   Diagnoses: Pre-Op Diagnoses: Right subtrochanteric femur fracture   Post-Op Diagnosis: Same  Procedures: CPT 27506-Cephalomedullary nailing of right subtrochanteric femur fracture  Surgeons : Primary: Roby Lofts, MD  Assistant: Ulyses Southward, PA-C  Location: OR 5   Anesthesia:General  Antibiotics: Ancef 2g preop with 1 gm vancomycin powder placed topically   Tourniquet time:None  Estimated Blood Loss:100 mL  Complications:None   Specimens:None   Implants: Implant Name Type Inv. Item Serial No. Manufacturer Lot No. LRB No. Used Action  NAIL LOCK CANN 10X420 130D RT - [id number] Nail NAIL LOCK CANN 10X420 130D RT  [facility] [employer] [id number] Right 1 Implanted  SCREW LAG COMBO 90.85 - [id number] Screw SCREW LAG COMBO 90.85  [employer] [id number] Right 1 Implanted  SCREW TRIGEN LOW PROF 5.0X45 - [id number] Screw SCREW TRIGEN LOW PROF 5.0X45  [employer] [id number] Right 1 Implanted  SCREW TRIGEN LOW PROF 5.0X45 - [id number] Screw SCREW TRIGEN LOW PROF 5.0X45  [employer] [id number] Right 1 Implanted     Indications for Surgery: 55 year old male who slipped at work.  He sustained a right subtrochanteric/intertrochanteric femur fracture.  Due to the unstable nature of his injury I recommend proceeding with cephalomedullary nailing of his right femur.  Risks and benefits were discussed with the patient.  Risks include but not limited to bleeding, infection, malunion, nonunion, hardware failure, heart rotation, nerve or blood vessel injury, DVT, even possibility anesthetic complications.  The patient agreed to proceed with surgery and consent was obtained.  Operative Findings: Cephalomedullary nailing of right femur using [employer] InterTAN 10 x nail with a 90 mm lag screw with 2 distal interlocking  screws.  Procedure: The patient was identified in the preoperative holding area. Consent was confirmed with the patient and their family and all questions were answered. The operative extremity was marked after confirmation with the patient. he was then brought back to the operating room by our anesthesia colleagues.  He was placed under general anesthetic.  He was carefully transition to a Hana table.  All bony prominences were well-padded.  Fluoroscopic imaging was then obtained to show the unstable nature of his injury.  Traction was applied through his right lower extremity and adequate alignment was obtained.  The right lower extremity was then prepped and draped in usual sterile fashion.  A timeout was performed to verify the patient, the procedure, and the extremity.  Preoperative antibiotics were dosed.  A small incision proximal to the greater trochanter was made and the gluteal fascia was split in line with the incision.  I then directed a threaded guidewire at the tip of the greater trochanter into the proximal metaphysis.  I confirmed positioning with AP and lateral fluoroscopic imaging.  I then used an entry reamer to enter the medullary canal.  I then passed a ball-tipped guidewire down the center canal and seated it into the distal metaphysis.  I then measured the length of the nail and chose to use a 420 mm nail.  I sequentially reamed from 23mm to 11.5 mm and chose to place a 10 mm nail.  The nail was then attached to the targeting arm and placed within the center of the bone.  During reaming I had made a lateral incision and used a bone hook to help manipulate the shaft segment into reduction.  I then extended the lateral  incision and used the targeting arm to direct a threaded guidewire up into the head neck segment.  I confirmed adequate tip apex distance.  I then measured the length of the wire and chose to use a 90 mm lag screw.  I then drilled the path for the compression screw.  Placed  an antirotation bar.  I then drilled the path for the lag screw and then placed the lag screw.  I then placed the compression screw and obtained approximately 3 to 5 mm of compression.  The nail was then statically locked.  The targeting arm was removed.  I then used perfect circle technique to place lateral to medial distal interlocking screws.  2 screws were placed.  Final fluoroscopic imaging was obtained.  The incisions were copiously irrigated.  A gram of vancomycin powder was placed into the incisions.  A layer closure of 2-0 Vicryl, 3-0 Monocryl and Dermabond was used to close the skin.  Sterile dressings were placed.  The patient was awoken from anesthesia and taken to the PACU in stable condition.   Post Op Plan/Instructions: Patient will be weightbearing as tolerated to the right lower extremity.  He will receive postoperative Ancef.  He will receive Lovenox for DVT prophylaxis.  We will have him mobilize with physical and Occupational Therapy.  I was present and performed the entire surgery.  Ulyses Southward, PA-C did assist me throughout the case. An assistant was necessary given the difficulty in approach, maintenance of reduction and ability to instrument the fracture.   Truitt Merle, MD [facility]

## 2021-03-11 NOTE — Progress Notes (Signed)
Orthopedic Tech Progress Note Patient Details:  Martin Williams 1966-09-21 [id number]  Musculoskeletal Traction Type of Traction: Bucks Skin Traction Traction Location: rle Traction Weight: 15 lbs   Post Interventions Patient Tolerated: Well Instructions Provided: Care of device,Adjustment of device   Trinna Post 03/11/2021, 12:38 AM

## 2021-03-11 NOTE — Interval H&P Note (Signed)
History and Physical Interval Note:  03/11/2021 8:09 AM  Martin Williams  has presented today for surgery, with the diagnosis of Right intertrochanteric femur fracture.  The various methods of treatment have been discussed with the patient and family. After consideration of risks, benefits and other options for treatment, the patient has consented to  Procedure(s): INTRAMEDULLARY (IM) NAIL INTERTROCHANTRIC (Right) as a surgical intervention.  The patient's history has been reviewed, patient examined, no change in status, stable for surgery.  I have reviewed the patient's chart and labs.  Questions were answered to the patient's satisfaction.     [name]

## 2021-03-11 NOTE — Consult Note (Signed)
Orthopaedic Trauma Service (OTS) Consult   Patient ID: Martin Williams MRN: [id number] DOB/AGE: 56-Jul-1967 56 y.o.  Reason for Consult:Right hip fracture Referring Physician: Dr. Alvira Monday, MD [facility]  HPI: Martin Williams is an 55 y.o. male who is being seen in consultation at the crest of Dr. Dalene Seltzer for evaluation of right hip fracture.  Patient works at [employer] where the floor was just a recently mopped.  He slipped and fell and landed on his right side.  He had immediate pain and deformity and inability bear weight.  He was brought to the emergency room and x-ray showed a right intertrochanteric/subtrochanteric femur fracture.  I was consulted for evaluation and treatment.  He was placed in Buck's traction overnight.  Patient was seen and evaluated on 5 N.  Patient lives here in [place] with his niece.  He lives on the ground story.  No stairs to get in.  He works as [employer] as noted above.  He smokes about half a pack of cigarettes a day.  He does not have any medical problems but he does not see her doctor regularly.  Denies any illicit drug use.  He is otherwise active and ambulates without assist device.  Recently moved here to [place] he lived in [place] for 5 to 6 years previously.  He denies any other injuries in his left lower extremity or bilateral upper extremities.  Denies any numbness or tingling.  No past medical history on file.  No past surgical history on file.  No family history on file.  Social History:  reports that he has never smoked. He has never used smokeless tobacco. No history on file for alcohol use and drug use.  Allergies: No Known Allergies  Medications:  No current facility-administered medications on file prior to encounter.   No current outpatient medications on file prior to encounter.    ROS: Constitutional: No fever or chills Vision: No changes in vision ENT: No difficulty swallowing CV: No chest pain Pulm: No SOB or  wheezing GI: No nausea or vomiting GU: No urgency or inability to hold urine Skin: No poor wound healing Neurologic: No numbness or tingling Psychiatric: No depression or anxiety Heme: No bruising Allergic: No reaction to medications or food   Exam: Blood pressure 132/82, pulse 70, temperature 98.5 F (36.9 C), temperature source Oral, resp. rate 18, height 6\' 2"  (1.88 m), weight 79.4 kg, SpO2 100 %. General: No acute distress Orientation: Awake alert and oriented x3 Mood and Affect: Cooperative and pleasant Gait: Unable to assess due to his fracture Coordination and balance: Within normal limits  Right lower extremity: Obvious deformity to the right hip.  Leg is externally rotated and in Buck's traction.  Compartments are soft and compressible.  Patient has active dorsiflexion plantarflexion of his toes and ankle.  He is warm well perfused foot.  Unable to assess further instability exam motor function secondary to his fracture.  No lymphadenopathy.  Reflexes within normal limits.  Left lower extremity: Skin without lesions. No tenderness to palpation. Full painless ROM, full strength in each muscle groups without evidence of instability.   Medical Decision Making: Data: Imaging: X-rays of the right hip show a comminuted proximal femur fracture with significant displacement and varus angulation.  Labs:  Results for orders placed or performed during the hospital encounter of 03/10/21 (from the past 24 hour(s))  CBC with Differential     Status: Abnormal   Collection Time: 03/10/21 10:26 PM  Result Value  Ref Range   WBC 9.7 4.0 - 10.5 K/uL   RBC 5.11 4.22 - 5.81 MIL/uL   Hemoglobin 11.5 (L) 13.0 - 17.0 g/dL   HCT 32.9 (L) 51.8 - 84.1 %   MCV 71.6 (L) 80.0 - 100.0 fL   MCH 22.5 (L) 26.0 - 34.0 pg   MCHC 31.4 30.0 - 36.0 g/dL   RDW 66.0 (H) 63.0 - 16.0 %   Platelets 184 150 - 400 K/uL   nRBC 0.0 0.0 - 0.2 %   Neutrophils Relative % 79 %   Neutro Abs 7.7 1.7 - 7.7 K/uL    Lymphocytes Relative 11 %   Lymphs Abs 1.1 0.7 - 4.0 K/uL   Monocytes Relative 8 %   Monocytes Absolute 0.8 0.1 - 1.0 K/uL   Eosinophils Relative 1 %   Eosinophils Absolute 0.1 0.0 - 0.5 K/uL   Basophils Relative 1 %   Basophils Absolute 0.1 0.0 - 0.1 K/uL   Immature Granulocytes 0 %   Abs Immature Granulocytes 0.04 0.00 - 0.07 K/uL  Comprehensive metabolic panel     Status: Abnormal   Collection Time: 03/10/21 10:26 PM  Result Value Ref Range   Sodium 131 (L) 135 - 145 mmol/L   Potassium 3.6 3.5 - 5.1 mmol/L   Chloride 102 98 - 111 mmol/L   CO2 23 22 - 32 mmol/L   Glucose, Bld 109 (H) 70 - 99 mg/dL   BUN 6 6 - 20 mg/dL   Creatinine, Ser 1.09 0.61 - 1.24 mg/dL   Calcium 8.9 8.9 - 32.3 mg/dL   Total Protein 7.3 6.5 - 8.1 g/dL   Albumin 4.1 3.5 - 5.0 g/dL   AST 34 15 - 41 U/L   ALT 15 0 - 44 U/L   Alkaline Phosphatase 61 38 - 126 U/L   Total Bilirubin 0.4 0.3 - 1.2 mg/dL   GFR, Estimated >55 >73 mL/min   Anion gap 6 5 - 15  Type and screen [facility]     Status: None   Collection Time: 03/10/21 10:32 PM  Result Value Ref Range   ABO/RH(D) O POS    Antibody Screen NEG    Sample Expiration      03/13/2021,2359 Performed at [facility], [address] [place], [place] [address]   Resp Panel by RT-PCR (Flu A&B, Covid) Nasopharyngeal Swab     Status: None   Collection Time: 03/10/21 10:57 PM   Specimen: Nasopharyngeal Swab; Nasopharyngeal(NP) swabs in vial transport medium  Result Value Ref Range   SARS Coronavirus 2 by RT PCR NEGATIVE NEGATIVE   Influenza A by PCR NEGATIVE NEGATIVE   Influenza B by PCR NEGATIVE NEGATIVE  ABO/Rh     Status: None   Collection Time: 03/10/21 11:00 PM  Result Value Ref Range   ABO/RH(D)      O POS Performed at [facility], [address], [place], [place] [address]   Surgical pcr screen     Status: None   Collection Time: 03/11/21  1:34 AM   Specimen: Nasal Mucosa; Nasal Swab  Result Value Ref Range    MRSA, PCR NEGATIVE NEGATIVE   Staphylococcus aureus NEGATIVE NEGATIVE  HIV Antibody (routine testing w rflx)     Status: None   Collection Time: 03/11/21  1:36 AM  Result Value Ref Range   HIV Screen 4th Generation wRfx Non Reactive Non Reactive    Imaging or Labs ordered: None  Medical history and chart  was reviewed and case discussed with medical provider.  Assessment/Plan: 55 year old male with a right intertrochanteric/subtrochanteric femur fracture.  Due to the unstable nature of his injury I recommended intramedullary nailing of his a right femur fracture.  Risks and benefits were discussed with the patient.  Risks included but not limited to bleeding, infection, malunion, nonunion, hardware failure, hardware irritation, nerve and blood vessel injury, DVT, even possibility anesthetic complications.  The patient agreed to proceed with surgery and consent was obtained.  Roby Lofts, MD [facility] [phone] (office) [email or web address]

## 2021-03-11 NOTE — Anesthesia Procedure Notes (Signed)
Procedure Name: Intubation Date/Time: 03/11/2021 8:23 AM Performed by: Marena Chancy, CRNA Pre-anesthesia Checklist: Patient identified, Emergency Drugs available, Suction available and Patient being monitored Patient Re-evaluated:Patient Re-evaluated prior to induction Oxygen Delivery Method: Circle System Utilized Preoxygenation: Pre-oxygenation with 100% oxygen Induction Type: IV induction Ventilation: Mask ventilation without difficulty Laryngoscope Size: Miller and 2 Grade View: Grade I Tube type: Oral Tube size: 7.5 mm Number of attempts: 1 Airway Equipment and Method: Stylet and Oral airway Placement Confirmation: ETT inserted through vocal cords under direct vision,  positive ETCO2 and breath sounds checked- equal and bilateral Tube secured with: Tape Dental Injury: Teeth and Oropharynx as per pre-operative assessment

## 2021-03-11 NOTE — Progress Notes (Signed)
Patient to OR

## 2021-03-11 NOTE — Transfer of Care (Signed)
Immediate Anesthesia Transfer of Care Note  Patient: Martin Williams  Procedure(s) Performed: INTRAMEDULLARY (IM) NAIL INTERTROCHANTRIC (Right )  Patient Location: PACU  Anesthesia Type:General  Level of Consciousness: awake, alert  and oriented  Airway & Oxygen Therapy: Patient Spontanous Breathing and Patient connected to face mask oxygen  Post-op Assessment: Report given to RN, Post -op Vital signs reviewed and stable and Patient moving all extremities X 4  Post vital signs: Reviewed and stable  Last Vitals:  Vitals Value Taken Time  BP 163/96 03/11/21 0950  Temp 36.8 C 03/11/21 0950  Pulse 89 03/11/21 0958  Resp 19 03/11/21 0958  SpO2 95 % 03/11/21 0958  Vitals shown include unvalidated device data.  Last Pain:  Vitals:   03/11/21 0750  TempSrc:   PainSc: 9       Patients Stated Pain Goal: 3 (03/11/21 0750)  Complications: No complications documented.

## 2021-03-11 NOTE — Progress Notes (Signed)
PT Cancellation Note  Patient Details Name: Martin Williams MRN: [id number] DOB: 1966/07/02   Cancelled Treatment:    Reason Eval/Treat Not Completed: Other (comment)   Not for surgical fixation today;  Will follow up for PT eval postop;   [name], [name]  Acute Rehabilitation Services Pager [phone] Office [phone]    [name] 03/11/2021, 7:40 AM

## 2021-03-12 ENCOUNTER — Encounter (HOSPITAL_COMMUNITY): Payer: Self-pay | Admitting: Student

## 2021-03-12 LAB — BASIC METABOLIC PANEL
Anion gap: 6 (ref 5–15)
Anion gap: 4 — ABNORMAL LOW (ref 5–15)
BUN: <5 mg/dL — ABNORMAL LOW (ref 6–20)
BUN: 6 mg/dL (ref 6–20)
CO2: 24 mmol/L (ref 22–32)
CO2: 26 mmol/L (ref 22–32)
Calcium: 8.3 mg/dL — ABNORMAL LOW (ref 8.9–10.3)
Calcium: 8.4 mg/dL — ABNORMAL LOW (ref 8.9–10.3)
Chloride: 99 mmol/L (ref 98–111)
Chloride: 99 mmol/L (ref 98–111)
Creatinine, Ser: 0.67 mg/dL (ref 0.61–1.24)
Creatinine, Ser: 0.8 mg/dL (ref 0.61–1.24)
GFR, Estimated: >60 mL/min (ref >60)
GFR, Estimated: >60 mL/min (ref >60)
Glucose, Bld: 147 mg/dL — ABNORMAL HIGH (ref 70–99)
Glucose, Bld: 130 mg/dL — ABNORMAL HIGH (ref 70–99)
Potassium: 3.7 mmol/L (ref 3.5–5.1)
Potassium: 3.5 mmol/L (ref 3.5–5.1)
Sodium: 129 mmol/L — ABNORMAL LOW (ref 135–145)
Sodium: 129 mmol/L — ABNORMAL LOW (ref 135–145)

## 2021-03-12 LAB — CBC
HCT: 21.6 % — ABNORMAL LOW (ref 39.0–52.0)
HCT: 20.2 % — ABNORMAL LOW (ref 39.0–52.0)
HCT: 24.5 % — ABNORMAL LOW (ref 39.0–52.0)
Hemoglobin: 7.3 g/dL — ABNORMAL LOW (ref 13.0–17.0)
Hemoglobin: 6.7 g/dL — CL (ref 13.0–17.0)
Hemoglobin: 8.1 g/dL — ABNORMAL LOW (ref 13.0–17.0)
MCH: 23.3 pg — ABNORMAL LOW (ref 26.0–34.0)
MCH: 23.3 pg — ABNORMAL LOW (ref 26.0–34.0)
MCH: 24.2 pg — ABNORMAL LOW (ref 26.0–34.0)
MCHC: 33.8 g/dL (ref 30.0–36.0)
MCHC: 33.2 g/dL (ref 30.0–36.0)
MCHC: 33.1 g/dL (ref 30.0–36.0)
MCV: 69 fL — ABNORMAL LOW (ref 80.0–100.0)
MCV: 70.1 fL — ABNORMAL LOW (ref 80.0–100.0)
MCV: 73.1 fL — ABNORMAL LOW (ref 80.0–100.0)
Platelets: 147 10*3/uL — ABNORMAL LOW (ref 150–400)
Platelets: 141 10*3/uL — ABNORMAL LOW (ref 150–400)
Platelets: 155 10*3/uL (ref 150–400)
RBC: 3.13 MIL/uL — ABNORMAL LOW (ref 4.22–5.81)
RBC: 2.88 MIL/uL — ABNORMAL LOW (ref 4.22–5.81)
RBC: 3.35 MIL/uL — ABNORMAL LOW (ref 4.22–5.81)
RDW: 15.1 % (ref 11.5–15.5)
RDW: 15 % (ref 11.5–15.5)
RDW: 17.2 % — ABNORMAL HIGH (ref 11.5–15.5)
WBC: 9.3 10*3/uL (ref 4.0–10.5)
WBC: 10.6 10*3/uL — ABNORMAL HIGH (ref 4.0–10.5)
WBC: 12.5 10*3/uL — ABNORMAL HIGH (ref 4.0–10.5)
nRBC: 0 % (ref 0.0–0.2)
nRBC: 0 % (ref 0.0–0.2)
nRBC: 0 % (ref 0.0–0.2)

## 2021-03-12 LAB — PREPARE RBC (CROSSMATCH)

## 2021-03-12 LAB — FERRITIN: Ferritin: 146 ng/mL (ref 24–336)

## 2021-03-12 MED ORDER — VITAMIN D (ERGOCALCIFEROL) 1.25 MG (50000 UNIT) PO CAPS
50000.0000 [IU] | ORAL_CAPSULE | ORAL | Status: DC
  Administered 2021-03-13: 50000 [IU] via ORAL
  Filled 2021-03-12: qty 1

## 2021-03-12 MED ORDER — SODIUM CHLORIDE 0.9% IV SOLUTION: Freq: Once | INTRAVENOUS | Status: AC

## 2021-03-12 MED ORDER — VITAMIN D 25 MCG (1000 UNIT) PO TABS
2000.0000 [IU] | ORAL_TABLET | Freq: Every day | ORAL | Status: DC
  Administered 2021-03-12 – 2021-03-16 (×5): 2000 [IU] via ORAL
  Filled 2021-03-12 (×5): qty 2

## 2021-03-12 MED ORDER — MIDAZOLAM HCL 2 MG/2ML IJ SOLN
INTRAMUSCULAR | Status: AC
  Filled 2021-03-12: qty 2

## 2021-03-12 NOTE — Evaluation (Signed)
Occupational Therapy Evaluation Patient Details Name: Martin Williams MRN: [id number] DOB: Dec 08, 1966 Today's Date: 03/12/2021    History of Present Illness Pt is a 55 yo male who slipped at work on a wet floor and sustained a R displaced intertrochanteric fx who then underwent an IM nail to R hip on 3/27. PMH: unremarkable   Clinical Impression   This 55 yo male admitted and underwent above presents to acute OT with PLOF of being totally independent with basic ADLs, IADLs, and working. He currently is setup/S-max A for basic ADLs, has decreased feeling in RLE, and decreased balance. He will continue to benefit from acute OT without need for follow up.    Follow Up Recommendations  No OT follow up;Supervision - Intermittent    Equipment Recommendations  3 in 1 bedside commode;Tub/shower bench       Precautions / Restrictions Precautions Precautions: Fall Precaution Comments: R LE numb Restrictions Weight Bearing Restrictions: No RLE Weight Bearing: Weight bearing as tolerated      Mobility Bed Mobility Overal bed mobility: Needs Assistance Bed Mobility: Sit to Supine     Sit to supine: Min guard   General bed mobility comments: Pt instructed in how to use his LLE under his RLE to get both legs into bed    Transfers Overall transfer level: Needs assistance Equipment used: Rolling walker (2 wheeled) Transfers: Sit to/from [employer] Sit to Stand: Min assist Stand pivot transfers: Min assist          Balance Overall balance assessment: Needs assistance Sitting-balance support: No upper extremity supported;Feet supported Sitting balance-Leahy Scale: Fair     Standing balance support: Bilateral upper extremity supported;During functional activity Standing balance-Leahy Scale: Poor Standing balance comment: dependent on bilat UEs and RW                           ADL either performed or assessed with clinical judgement   ADL Overall  ADL's : Needs assistance/impaired Eating/Feeding: Independent;Sitting   Grooming: Set up;Sitting   Upper Body Bathing: Set up;Sitting   Lower Body Bathing: Maximal assistance Lower Body Bathing Details (indicate cue type and reason): min A sit<>stand Upper Body Dressing : Set up;Sitting   Lower Body Dressing: Maximal assistance Lower Body Dressing Details (indicate cue type and reason): min A sit<>stand Toilet Transfer: Minimal assistance;Stand-pivot;RW Toilet Transfer Details (indicate cue type and reason): Recliner>bed going to his left Toileting- Clothing Manipulation and Hygiene: Minimal assistance;Sit to/from stand               Vision Patient Visual Report: No change from baseline              Pertinent Vitals/Pain Pain Assessment: 0-10 Pain Score: 7  Faces Pain Scale: Hurts whole lot Pain Location: R LE Pain Descriptors / Indicators: Aching;Discomfort;Sharp Pain Intervention(s): Limited activity within patient's tolerance;Monitored during session;Repositioned;RN gave pain meds during session;Patient requesting pain meds-RN notified     Hand Dominance Right   Extremity/Trunk Assessment Upper Extremity Assessment Upper Extremity Assessment: Overall WFL for tasks assessed     Communication Communication Communication: No difficulties   Cognition Arousal/Alertness: Awake/alert Behavior During Therapy: WFL for tasks assessed/performed Overall Cognitive Status: Within Functional Limits for tasks assessed  Home Living Family/patient expects to be discharged to:: Private residence Living Arrangements: Other relatives (sister and niece) Available Help at Discharge: Family;Available 24 hours/day Type of Home: House Home Access: Level entry     Home Layout: One level     Bathroom Shower/Tub: Tub/shower unit;Curtain   Bathroom Toilet: Handicapped height     Home Equipment: None           Prior Functioning/Environment Level of Independence: Independent        Comments: working at [employer]        OT Problem List: Decreased strength;Decreased range of motion;Impaired balance (sitting and/or standing);Decreased knowledge of use of DME or AE;Pain      OT Treatment/Interventions: Self-care/ADL training;DME and/or AE instruction;Patient/family education;Balance training    OT Goals(Current goals can be found in the care plan section) Acute Rehab OT Goals Patient Stated Goal: get feeling back in my leg and pain better OT Goal Formulation: With patient Time For Goal Achievement: 03/26/21 Potential to Achieve Goals: Good  OT Frequency: Min 2X/week              AM-PAC OT "6 Clicks" Daily Activity     Outcome Measure Help from another person eating meals?: None Help from another person taking care of personal grooming?: A Little Help from another person toileting, which includes using toliet, bedpan, or urinal?: A Lot Help from another person bathing (including washing, rinsing, drying)?: A Lot Help from another person to put on and taking off regular upper body clothing?: A Little Help from another person to put on and taking off regular lower body clothing?: A Lot 6 Click Score: 16   End of Session Equipment Utilized During Treatment: Engineer, water Communication: Patient requests pain meds  Activity Tolerance: Patient limited by pain Patient left: in bed;with call bell/phone within reach;with bed alarm set  OT Visit Diagnosis: Unsteadiness on feet (R26.81);Other abnormalities of gait and mobility (R26.89);Muscle weakness (generalized) (M62.81);Pain Pain - Right/Left: Right Pain - part of body: Leg                Time: 6384-6659 OT Time Calculation (min): 18 min Charges:  OT General Charges $OT Visit: 1 Visit OT Evaluation $OT Eval Moderate Complexity: 1 Mod  Martin Williams, OTR/L Acute [employer] Pager [phone] Office  [phone]     Martin Williams 03/12/2021, 10:16 AM

## 2021-03-12 NOTE — Progress Notes (Signed)
Family Medicine Teaching Service Daily Progress Note Intern Pager: [phone]  Patient name: Martin Williams Medical record number: [id number] Date of birth: [birth year] Age: 55 y.o. Gender: male  Primary Care Provider: Patient, No Pcp Per Consultants: Ortho  Code Status: Full   Pt Overview and Major Events to Date:  3/27 Admitted   Assessment and Plan:  Martin Williams is a 55 y.o. male presenting with R hip fx. PMH is significant for none.  R hip fx s/p ORIF R lateral thigh edematous with reduced sensation to touch. Patient endorses numbness and tingling of R leg - Ortho following, appreciate continued care and recommendations    - pain regimen per Ortho   - will see pt and change wound dressing tomorrow 3/29 - PT/OT  Mild microcytic anemia  Acute blood loss anemia  Patient hgb dropped to 6.7 from 11.5 on admission  - transfuse 1u pRBC - f/u H&H this evening - am CBC   Hyponatremia mild. Na 131 on admission and appears chronic.  - am BMP   Hypokalemia Vit D level 8  - 2000 units daily, 50,000 units weekly per Ortho    FEN/GI:  Prophylaxis: SCD, lovenox s/p procedure  Disposition: Home likely tomorrow 3/29 pending ambulation   Subjective:  No acute events overnight. Patient feeling sore but states pain is improving. Medicine has been helping. Denies any other complaints   Objective: Temp:  [98 F (36.7 C)-98.8 F (37.1 C)] 98 F (36.7 C) (03/28 0837) Pulse Rate:  [93-100] 100 (03/28 0837) Resp:  [16] 16 (03/28 0837) BP: (109-122)/(62-83) 111/74 (03/28 0837) SpO2:  [97 %-98 %] 97 % (03/28 0837) Physical Exam: General: alert, laying in bed, NAD Cardiovascular: RRR no murmurs Respiratory: CTAB normal WOB Abdomen: soft, non distended, non tender Extremities: R leg with edema of upper lateral thigh with tightness and tenderness to palpation and reduced sensation. Non erythematous. Wound dressings without drainage or bleeding.   Laboratory: Recent Labs  Lab  03/10/21 2226 03/11/21 1432 03/12/21 0802  WBC 9.7 10.0 9.3  HGB 11.5* 8.8* 7.3*  HCT 36.6* 27.5* 21.6*  PLT 184 162 147*   Recent Labs  Lab 03/10/21 2226 03/11/21 1432 03/12/21 0454  NA 131* 132* 129*  K 3.6 3.5 3.7  CL 102 100 99  CO2 23 24 24   BUN 6 5* <5*  CREATININE 0.74 0.71 0.67  CALCIUM 8.9 8.4* 8.3*  PROT 7.3  --   --   BILITOT 0.4  --   --   ALKPHOS 61  --   --   ALT 15  --   --   AST 34  --   --   GLUCOSE 109* 114* 147*     Imaging/Diagnostic Tests: None new  , DO 03/12/2021, 11:27 AM PGY-1, [place] [facility] FPTS Intern pager: [phone], text pages welcome

## 2021-03-12 NOTE — Evaluation (Signed)
Physical Therapy Evaluation Patient Details Name: Martin Williams MRN: [id number] DOB: 06/25/1966 Today's Date: 03/12/2021   History of Present Illness  Pt is a 55yo male who slipped at work on a wet floor who sustained a R displaced intertrochanteric fx who then underwent an IM nail to R hip on 3/27. PMH: unremarkable    Clinical Impression  Pt admitted with above. Pt pleasant and cooperative with c/o pain with movement and numbness t/o R LE. Pt with noted drop foot/inability to dorsiflex limiting pt ability to clear R foot during stepping causing pt to drag/slide R foot during std pvt transfer to chair. Attempted to ambulate however pt with R knee buckling and inability to advance R LE without maxA. Pt also with increased pain limiting pts ability to tolerate WBing through R LE as well. Sarah, PA notified about R LE numbness. Anticipate pt to progress well once pain is under control and the numbness improves. Acute PT to cont to follow to progress mobility and assess d/c recommendations.    Follow Up Recommendations Home health PT;Supervision/Assistance - 24 hour    Equipment Recommendations  Rolling walker with 5" wheels    Recommendations for Other Services       Precautions / Restrictions Precautions Precautions: Fall Precaution Comments: R LE numb Restrictions Weight Bearing Restrictions: Yes RLE Weight Bearing: Weight bearing as tolerated      Mobility  Bed Mobility Overal bed mobility: Needs Assistance Bed Mobility: Supine to Sit     Supine to sit: Min assist;Mod assist     General bed mobility comments: HOB flat, minA at trunk during attempting to pop up on elbows to achieve long sit, minA for R LE managment, verbal directional cues    Transfers Overall transfer level: Needs assistance Equipment used: Rolling walker (2 wheeled) Transfers: Sit to/from [employer] Sit to Stand: Mod assist;From elevated surface Stand pivot transfers: Mod assist        General transfer comment: pt with minimal R LE WBing tolerance and was also unable to clear foot with noted foot drop, pt with noted knee buckling as well as pt c/o numbness t/o R LE stating "I can't feel it", attempted to ambulate however unable to clear R foot/dragging toes  Ambulation/Gait             General Gait Details: unable to at this time  Stairs            Wheelchair Mobility    Modified Rankin (Stroke Patients Only)       Balance Overall balance assessment: Needs assistance Sitting-balance support: Feet supported;No upper extremity supported Sitting balance-Leahy Scale: Fair     Standing balance support: Bilateral upper extremity supported;During functional activity Standing balance-Leahy Scale: Poor Standing balance comment: dependent on bilat UEs and RW                             Pertinent Vitals/Pain Pain Assessment: Faces Faces Pain Scale: Hurts whole lot Pain Location: R LE with movement, okay at rest Pain Descriptors / Indicators: Aching;Discomfort;Sharp Pain Intervention(s): Patient requesting pain meds-RN notified    Home Living Family/patient expects to be discharged to:: Private residence Living Arrangements: Other relatives (sister and niece) Available Help at Discharge: Family;Available 24 hours/day Type of Home: House Home Access: Level entry     Home Layout: One level Home Equipment: None      Prior Function Level of Independence: Independent  Comments: working at [employer]   Dominant Hand: Right    Extremity/Trunk Assessment   Upper Extremity Assessment Upper Extremity Assessment: Overall WFL for tasks assessed    Lower Extremity Assessment Lower Extremity Assessment: RLE deficits/detail RLE Deficits / Details: c/o numbness, able to initiated glut and quad set, initially unable to initiate LAQ but then after AA was able to initiate, pt with noted drop foot/inability to  actively dorsiflex    Cervical / Trunk Assessment Cervical / Trunk Assessment: Normal  Communication   Communication: No difficulties  Cognition Arousal/Alertness: Awake/alert Behavior During Therapy: WFL for tasks assessed/performed Overall Cognitive Status: Within Functional Limits for tasks assessed                                        General Comments General comments (skin integrity, edema, etc.): R LE with swelling and tightness from knee to hip    Exercises General Exercises - Lower Extremity Ankle Circles/Pumps: AAROM;Right;10 reps;Supine Quad Sets: AROM;Right;10 reps;Supine Gluteal Sets: AROM;Right;10 reps;Supine Long Arc Quad: AAROM;Right;10 reps;Seated   Assessment/Plan    PT Assessment Patient needs continued PT services  PT Problem List Decreased strength;Decreased range of motion;Decreased activity tolerance;Decreased balance;Decreased coordination;Decreased cognition;Decreased mobility;Decreased knowledge of use of DME;Pain       PT Treatment Interventions DME instruction;Gait training;Functional mobility training;Therapeutic activities;Therapeutic exercise;Balance training    PT Goals (Current goals can be found in the Care Plan section)  Acute Rehab PT Goals Patient Stated Goal: get feeling back in my leg PT Goal Formulation: With patient Time For Goal Achievement: 03/26/21 Potential to Achieve Goals: Good    Frequency Min 4X/week   Barriers to discharge        Co-evaluation               AM-PAC PT "6 Clicks" Mobility  Outcome Measure Help needed turning from your back to your side while in a flat bed without using bedrails?: A Little Help needed moving from lying on your back to sitting on the side of a flat bed without using bedrails?: A Little Help needed moving to and from a bed to a chair (including a wheelchair)?: A Lot Help needed standing up from a chair using your arms (e.g., wheelchair or bedside chair)?: A  Lot Help needed to walk in hospital room?: A Lot Help needed climbing 3-5 steps with a railing? : A Lot 6 Click Score: 14    End of Session Equipment Utilized During Treatment: Gait belt Activity Tolerance: Patient limited by pain Patient left: in chair;with call bell/phone within reach;with chair alarm set Nurse Communication: Mobility status;Patient requests pain meds PT Visit Diagnosis: Muscle weakness (generalized) (M62.81);Difficulty in walking, not elsewhere classified (R26.2);Pain Pain - Right/Left: Right Pain - part of body: Leg    Time: 4270-6237 PT Time Calculation (min) (ACUTE ONLY): 40 min   Charges:   PT Evaluation $PT Eval Moderate Complexity: 1 Mod PT Treatments $Therapeutic Exercise: 8-22 mins $Therapeutic Activity: 8-22 mins        Lewis Shock, PT, DPT Acute Rehabilitation Services Pager #: [phone] Office #: [phone]   Iona Hansen 03/12/2021, 8:52 AM

## 2021-03-12 NOTE — Progress Notes (Signed)
Patient re-evaluated this afternoon due to having complaints of numbness in the right leg when and being unable to dorsiflex ankle when working with physical therapy this morning.  12:15 p.m.-patient doing well currently.  Minimal pain at rest.  Does have some decrease sensation with light touch over the right lateral thigh as expected. To the patient this is likely related to the skin nerves that are involved with the incisions made during surgery.  He endorses sensation to light touch over the anterior, posterior, medial aspect of his thigh as well as throughout the entirety of the lower leg, ankle, foot.  He has ankle dorsiflexion to neutral.  Plantarflexion strength 4/5 compared to contralateral side.  Is able to wiggle toes.  As noted earlier, thigh is swollen but compartments are compressible.+ DP pulse.  We will plan to evaluate patient again tomorrow morning and remove dressings at that time.  Continue working with therapies as tolerated.  Continue with as needed pain medications, preferring oral medications.    Aeralyn Barna A. Ladonna Snide [facility] [phone] (office) [email or web address]

## 2021-03-12 NOTE — Progress Notes (Addendum)
Family Medicine Teaching Service Daily Progress Note Intern Pager: [phone]  Patient name: Martin Williams Medical record number: [id number] Date of birth: 28-Dec-1965 Age: 55 y.o. Gender: male  Primary Care Provider: Patient, No Pcp Per Consultants: Ortho  Code Status: Full  Pt Overview and Major Events to Date:  3/27 Admitted, ORIF  3/28 Transfused 1u pRBC   Assessment and Plan: Martin Williams is a 55 y.o. male presenting with R hip fx. PMH is significant for none.  R hip fx s/p ORIF R lateral thigh edematous with reduced sensation to touch. Patient endorses numbness and tingling of R leg - Ortho following, appreciate continued care and recommendations               - pain regimen per Ortho   - d/c in 24-48 hours pending ambulation  - PT/OT  Mild microcytic anemia  Acute blood loss anemia  S/p 1 u pRBC. Hgb today 8.9  - transfuse 1u pRBC - f/u H&H this evening - am CBC   Hyponatremia mild. Na 131 on admission and appears chronic.  - am BMP   Hypokalemia Vit D level 8  - 2000 units daily, 50,000 units weekly per Ortho    FEN/GI:  Prophylaxis: SCD, lovenox   Disposition: Home likely in next 24-48h pending ambulation   Subjective:  No acute events overnight. Patient feeling ok this morning just states he was ready for his next pain medicine dose.   Objective: Temp:  [98 F (36.7 C)-100.3 F (37.9 C)] 100.3 F (37.9 C) (03/28 1947) Pulse Rate:  [93-101] 101 (03/28 1947) Resp:  [14-17] 17 (03/28 1947) BP: (109-138)/(62-83) 132/73 (03/28 1947) SpO2:  [97 %-100 %] 100 % (03/28 1947) Physical Exam: General: alert, in some pan, NAD Cardiovascular: RRR no murmurs  Respiratory: CTAB normal WOB Abdomen: soft, non distended, non tender  Extremities: R lower extremity edematous and tender to palpation. Wound dressings removed and incisions healing well without significant erythema or drainage   Laboratory: Recent Labs  Lab 03/12/21 0802 03/12/21 1507  03/12/21 2216  WBC 9.3 10.6* 12.5*  HGB 7.3* 6.7* 8.1*  HCT 21.6* 20.2* 24.5*  PLT 147* 141* 155   Recent Labs  Lab 03/10/21 2226 03/11/21 1432 03/12/21 0454  NA 131* 132* 129*  K 3.6 3.5 3.7  CL 102 100 99  CO2 23 24 24   BUN 6 5* <5*  CREATININE 0.74 0.71 0.67  CALCIUM 8.9 8.4* 8.3*  PROT 7.3  --   --   BILITOT 0.4  --   --   ALKPHOS 61  --   --   ALT 15  --   --   AST 34  --   --   GLUCOSE 109* 114* 147*    Imaging/Diagnostic Tests: None new  , DO 03/12/2021, 11:11 PM PGY-1, [facility] Family Medicine FPTS Intern pager: [phone], text pages welcome

## 2021-03-12 NOTE — TOC CAGE-AID Note (Signed)
Transition of Care [facility]) - CAGE-AID Screening   Patient Details  Name: Martin Williams MRN: [id number] Date of Birth: [birth year]  Transition of [facility]) CM/SW Contact:    Carley Hammed, LCSWA Phone Number: 03/12/2021, 10:24 AM   Clinical Narrative: CSW completed Cage - Aid Assessment with pt. He noted that he was at work when he was injured and was not drinking or using drugs. He stated that he does occasionally have a beer, but it is never an issue. He notes that he does not need resources at this time.   CAGE-AID Screening:    Have You Ever Felt You Ought to Cut Down on Your Drinking or Drug Use?: No Have People Annoyed You By Critizing Your Drinking Or Drug Use?: No Have You Felt Bad Or Guilty About Your Drinking Or Drug Use?: No Have You Ever Had a Drink or Used Drugs First Thing In The Morning to Steady Your Nerves or to Get Rid of a Hangover?: No CAGE-AID Score: 0  Substance Abuse Education Offered: No (Pt declined resources at this time.)

## 2021-03-12 NOTE — Plan of Care (Signed)
Patient had 1 unit of PRBC transfused this evening. Post transfusion H&H results pending. Applied ice pack to right hip to help with pain control, gave norco 7.5 mg x 2 tabs and Tylenol 650 mg for low grade fever. NAD otherwise. Will continue to monitor and continue current POC.

## 2021-03-12 NOTE — Progress Notes (Signed)
S: Night check on patient. Patient sitting up in bed reporting right thigh pain, largely unchanged. He has been using an ice pack on the area with minimal improvement in pain.  O: Blood pressure 132/73, pulse (!) 101, temperature 100.3 F (37.9 C), temperature source Oral, resp. rate 17, height 6\' 2"  (1.88 m), weight 79.4 kg, SpO2 100 %. Gen: NAD, sitting up in bed, well-appearing Respiratory: comfortable on room air, no increased WOB MSK: right thigh extremely tender to palpation laterally, lateral thigh swelling noted (patient reports it is stable), able to wiggle toes, thigh compartments are compressible, dorsalis pedis pulses 2+, able to move ankle in all direction.  A/P: R hip fracture s/p ORIF Patient well appearing, stable swelling and able to move right toes, compressible compartments at this time, sensation present. Hematoma appears stable from patient report. - Continue to monitor pain and sensation - Ortho following-appreciate recs  Acute blood loss anemia Patient Hgb 6.7 today, patient now s/p transfusion of 1U PRBC. No signs of active bleeding at this time, hematoma size seems stable from patient report. - F/u post-transfusion H&H - Continue to monitor with CBC in the AM   Kendal Raffo, DO

## 2021-03-12 NOTE — Plan of Care (Signed)
Patient is s/p right hip IM nailing by Dr. Jena Gauss. Two small foam dressings to right hip, clean dry and intact. Patient is not complaining of pain at this time. Refuses SCDs. IVF and IV antibx are given as ordered. Will continue to monitor and continue current POC.

## 2021-03-12 NOTE — Progress Notes (Signed)
Orthopaedic Trauma Progress Note  SUBJECTIVE: Reports minimal pain pain about operative site at rest.  Has not required any of the IV or oral pain medications.  No chest pain. No SOB. No nausea/vomiting. No other complaints.  Has not been out of bed yet, but is getting ready to work with therapies.  Patient states he lives at home with his sister, goals to return to home at discharge.  OBJECTIVE:  Vitals:   03/12/21 0039 03/12/21 0527  BP: 122/83 110/62  Pulse: 93 93  Resp:    Temp: 98.8 F (37.1 C) 98.7 F (37.1 C)  SpO2: 97% 98%    General: Sitting up in bed, NAD Respiratory: No increased work of breathing.  Right Lower Extremity: Dressing CDI.  Swelling to the thigh and tenderness with palpation throughout this area as expected. Ankle DF/PF intact. +EHL. +FHL.  Thigh swollen but compartments are compressible throughout.  Skin warm and dry.  +DP pulse   IMAGING: Stable post op imaging.   LABS:  Results for orders placed or performed during the hospital encounter of 03/10/21 (from the past 24 hour(s))  VITAMIN D 25 Hydroxy (Vit-D Deficiency, Fractures)     Status: Abnormal   Collection Time: 03/11/21  2:32 PM  Result Value Ref Range   Vit D, 25-Hydroxy 8.52 (L) 30 - 100 ng/mL  CBC     Status: Abnormal   Collection Time: 03/11/21  2:32 PM  Result Value Ref Range   WBC 10.0 4.0 - 10.5 K/uL   RBC 3.84 (L) 4.22 - 5.81 MIL/uL   Hemoglobin 8.8 (L) 13.0 - 17.0 g/dL   HCT 93.7 (L) 16.9 - 67.8 %   MCV 71.6 (L) 80.0 - 100.0 fL   MCH 22.9 (L) 26.0 - 34.0 pg   MCHC 32.0 30.0 - 36.0 g/dL   RDW 93.8 10.1 - 75.1 %   Platelets 162 150 - 400 K/uL   nRBC 0.0 0.0 - 0.2 %  Basic metabolic panel     Status: Abnormal   Collection Time: 03/11/21  2:32 PM  Result Value Ref Range   Sodium 132 (L) 135 - 145 mmol/L   Potassium 3.5 3.5 - 5.1 mmol/L   Chloride 100 98 - 111 mmol/L   CO2 24 22 - 32 mmol/L   Glucose, Bld 114 (H) 70 - 99 mg/dL   BUN 5 (L) 6 - 20 mg/dL   Creatinine, Ser 0.25 0.61 -  1.24 mg/dL   Calcium 8.4 (L) 8.9 - 10.3 mg/dL   GFR, Estimated >85 >27 mL/min   Anion gap 8 5 - 15  Basic metabolic panel     Status: Abnormal   Collection Time: 03/12/21  4:54 AM  Result Value Ref Range   Sodium 129 (L) 135 - 145 mmol/L   Potassium 3.7 3.5 - 5.1 mmol/L   Chloride 99 98 - 111 mmol/L   CO2 24 22 - 32 mmol/L   Glucose, Bld 147 (H) 70 - 99 mg/dL   BUN <5 (L) 6 - 20 mg/dL   Creatinine, Ser 7.82 0.61 - 1.24 mg/dL   Calcium 8.3 (L) 8.9 - 10.3 mg/dL   GFR, Estimated >42 >35 mL/min   Anion gap 6 5 - 15  Ferritin     Status: None   Collection Time: 03/12/21  4:54 AM  Result Value Ref Range   Ferritin 146 24 - 336 ng/mL    ASSESSMENT: Martin Williams is a 55 y.o. male, 1 Day Post-Op  s/p INTRAMEDULLARY NAIL  RIGHT INTERTROCHANTRIC FEMUR FRACTURE  CV/Blood loss: Acute blood loss anemia, Hgb 8.8 pre-op. CBC pending this AM. Hemodynamically stable  PLAN: Weightbearing: WBAT RLE Incisional and dressing care: Reinforce dressings as needed  Showering: Ok to begin showering with assistance 03/14/21 if no drainage from incisions Orthopedic device(s): None  Pain management:  1. Tylenol 325-650 mg q 6 hours PRN 2. Robaxin 500 mg q 6 hours PRN 3. Norco 5-325 mg q 4 hours PRN moderate pain 4. Norco 7.5-325 mg q 4 hours PRN severe pain 5. Morphine 0.5-1 mg q 2 hours PRN breakthrough pain VTE prophylaxis: Lovenox, SCDs ID:  Ancef 2gm post op Foley/Lines:  No foley, KVO IVFs Impediments to Fracture Healing: Vit D level 8, start on D3 and D2 supplementation Dispo: PT/OT eval today, dispo pending.  Patient will likely be ready for discharge from orthopedic standpoint in next 24 to 48 hours if pain remains well controlled and patient progresses well with therapies Follow - up plan: 2 weeks after d/c for repeat x-rays and wound check   Contact information:  Truitt Merle MD, Ulyses Southward PA-C. After hours and holidays please check [email or web address] for group call information for [facility]   Delayna Sparlin A. Michaelyn Barter, PA-C [phone] (office) [email or web address]

## 2021-03-13 DIAGNOSIS — D62 Acute posthemorrhagic anemia: Secondary | ICD-10-CM

## 2021-03-13 DIAGNOSIS — S72141D Displaced intertrochanteric fracture of right femur, subsequent encounter for closed fracture with routine healing: Secondary | ICD-10-CM

## 2021-03-13 LAB — CBC WITH DIFFERENTIAL/PLATELET
Abs Immature Granulocytes: 0.06 10*3/uL (ref 0.00–0.07)
Basophils Absolute: 0 10*3/uL (ref 0.0–0.1)
Basophils Relative: 0 %
Eosinophils Absolute: 0.2 10*3/uL (ref 0.0–0.5)
Eosinophils Relative: 1 %
HCT: 26.6 % — ABNORMAL LOW (ref 39.0–52.0)
Hemoglobin: 8.9 g/dL — ABNORMAL LOW (ref 13.0–17.0)
Immature Granulocytes: 0 %
Lymphocytes Relative: 19 %
Lymphs Abs: 2.5 10*3/uL (ref 0.7–4.0)
MCH: 24.5 pg — ABNORMAL LOW (ref 26.0–34.0)
MCHC: 33.5 g/dL (ref 30.0–36.0)
MCV: 73.3 fL — ABNORMAL LOW (ref 80.0–100.0)
Monocytes Absolute: 2.1 10*3/uL — ABNORMAL HIGH (ref 0.1–1.0)
Monocytes Relative: 15 %
Neutro Abs: 8.6 10*3/uL — ABNORMAL HIGH (ref 1.7–7.7)
Neutrophils Relative %: 65 %
Platelets: 147 10*3/uL — ABNORMAL LOW (ref 150–400)
RBC: 3.63 MIL/uL — ABNORMAL LOW (ref 4.22–5.81)
RDW: 17.2 % — ABNORMAL HIGH (ref 11.5–15.5)
WBC: 13.4 10*3/uL — ABNORMAL HIGH (ref 4.0–10.5)
nRBC: 0 % (ref 0.0–0.2)

## 2021-03-13 LAB — TYPE AND SCREEN
ABO/RH(D): O POS
Antibody Screen: NEGATIVE
Unit division: 0

## 2021-03-13 LAB — BPAM RBC
Blood Product Expiration Date: 202204262359
ISSUE DATE / TIME: 202203281636
Unit Type and Rh: 5100

## 2021-03-13 LAB — HEMOGLOBIN AND HEMATOCRIT, BLOOD
HCT: 23.8 % — ABNORMAL LOW (ref 39.0–52.0)
Hemoglobin: 8 g/dL — ABNORMAL LOW (ref 13.0–17.0)

## 2021-03-13 MED ORDER — ENOXAPARIN SODIUM 40 MG/0.4ML ~~LOC~~ SOLN
40.0000 mg | SUBCUTANEOUS | Status: DC
  Administered 2021-03-13: 40 mg via SUBCUTANEOUS
  Filled 2021-03-13: qty 0.4

## 2021-03-13 NOTE — TOC Initial Note (Addendum)
Transition of Care [facility]) - Initial/Assessment Note    Patient Details  Name: Martin Williams MRN: [id number] Date of Birth: [birth year]  Transition of [facility]) CM/SW Contact:    Epifanio Lesches, RN Phone Number: 03/13/2021, 2:51 PM  Clinical Narrative:      Pt s/p fall @ work ( [employer] / [place], [phone]), suffered R hip fx. From home with sister and niece. ? Workman's Compensation case. Pt states will f/u with job concerning matter.  States PTA independent with ADL's , no DME usage. Pt without PCP, no insurance... referral made with [facility] for screening  ( NCM spoke with Paul Half).              -s/p IMN to  R femur fx, 03/11/2021  Pt's  PT evaluation/  Recommendation: Home health PT;Supervision/Assistance - 24 hour. Pt agreeable to [facility] services. Pt without insurance. Referral made with [facility], charity case...Marland Kitchenacceptance pending  Pt with transportation to home once d/c ready. Pt might need Match Letter to assist with Rx med.  TOC team will continue to monitor for TOC needs  03/14/2021 @ 0815 [facility] declined pt for home health charity services. NCM has reached out to pt's job regarding possible workman's compensation. Voice message left ...awaiting  response.  03/14/2021 @ 10:50  NCM received call from Vickie [phone]), site Manager / [employer]. Vickie stated they are going to get in touch with their corporate office concerning workman's comp. matter and office is to f/u with NCM,  03/15/2021 @ 12:38 NCM spoke with [facility] manager Dahlia Client [phone]) regarding status of pt's workman's comp. claim. Dahlia Client stated she will email NCM papers for pt's to fill out and return to corporation office in order to establish claim.  NCM will continue to monitor and assist with TOC needs....  Expected Discharge Plan: Home w Home Health Services (Resides with sister and niece) Barriers to Discharge: Continued Medical Work up   Patient Goals and CMS Choice     Choice  offered to / list presented to : Patient  Expected Discharge Plan and Services Expected Discharge Plan: Home w Home Health Services (Resides with sister and niece)   Discharge Planning Services: CM Consult                                          Prior Living Arrangements/Services   Lives with:: Relatives Patient language and need for interpreter reviewed:: No Do you feel safe going back to the place where you live?: Yes      Need for Family Participation in Patient Care: Yes (Comment) Care giver support system in place?: Yes (comment)   Criminal Activity/Legal Involvement Pertinent to Current Situation/Hospitalization: No - Comment as needed  Activities of Daily Living Home Assistive Devices/Equipment: None ADL Screening (condition at time of admission) Patient's cognitive ability adequate to safely complete daily activities?: Yes Is the patient deaf or have difficulty hearing?: No Does the patient have difficulty seeing, even when wearing glasses/contacts?: No Does the patient have difficulty concentrating, remembering, or making decisions?: No Patient able to express need for assistance with ADLs?: Yes Does the patient have difficulty dressing or bathing?: No Independently performs ADLs?: Yes (appropriate for developmental age) Does the patient have difficulty walking or climbing stairs?: No Weakness of Legs: None Weakness of Arms/Hands: None  Permission Sought/Granted   Permission granted to share information with :  Yes, Verbal Permission Granted              Emotional Assessment Appearance:: Appears stated age Attitude/Demeanor/Rapport: Gracious Affect (typically observed): Accepting Orientation: : Oriented to Self,Oriented to Place,Oriented to  Time,Oriented to Situation Alcohol / Substance Use: Not Applicable Psych Involvement: No (comment)  Admission diagnosis:  Hip fracture (HCC) [S72.009A] Hip fx (HCC) [S72.009A] Patient Active Problem List    Diagnosis Date Noted  . Fall 03/11/2021  . Hip fracture (HCC) 03/10/2021   PCP:  Patient, No Pcp Per (Inactive) Pharmacy:   [id number] [place], [place] - [address] AT [facility] [address] [place] [address] Phone: [phone] Fax: [phone]  [facility] [place], [place] - [address] [place] [address] Phone: [phone] Fax: [phone]     Social Determinants of Health (SDOH) Interventions    Readmission Risk Interventions No flowsheet data found.

## 2021-03-13 NOTE — Hospital Course (Addendum)
Martin Williams is a 55 y.o. male presenting with R hip fx. PMH significant for tobacco use.   Right intertrochanteric fracture s/p ORIF 3/27 Patient admitted after mechanical fall, found to have a displaced right intertrochanteric fracture. Orthopedic surgery was consulted and patient underwent ORIF on 3/27. Patient worked with PT/OT and was able to ambulate prior to discharge. Patient was cautiously started on Lovenox for DVT prophylaxis given recent acute blood loss anemia and was transitioned to Eliquis prior to discharge.   Acute blood loss anemia Hemoglobin on 3/28 decreased from 8.8 on admission to 6.7. Patient was transfused 1U PRBC on 3/28 with subsequent appropriate rise in hemoglobin. Hemoglobin was *** prior to discharge.  Thrombocytopenia Patient platelets mildly decreased at 147 prior to discharge, suspected secondary to nutritional causes.   Vitamin D deficiency, concern for osteoporosis  Due to hip fracture from a fall at standing height, there was concern for possible underlying bone pathology. Calcium level was obtained and was mildly low at 8.3,***. As patient had a fracture at his age from a low height fall it is concerning for a pathologic fracture. Patient was started on D3 and D2 supplementation prior to discharge as this is a barrier to healing of the fracture.   Issues for follow-up Follow - up plan per Ortho: 2 weeks after d/c for repeat x-rays and wound check Orthopedics recommends eliquis 2.5mg  BID x30 days to prevent DVT s/p arthorplasty. Repeat CBC in 1 week to monitor Hgb and platelets Further outpatient evaluation of microcytic anemia, recommended repeat CBC in 3 months Evaluate need for bisphosphonate therapy given pathologic fracture.

## 2021-03-13 NOTE — Progress Notes (Signed)
Physical Therapy Treatment Patient Details Name: Martin Williams MRN: [id number] DOB: [birth year] Today's Date: 03/13/2021    History of Present Illness Pt is a 55 yo male who slipped at work on a wet floor and sustained a R displaced intertrochanteric fx who then underwent an IM nail to R hip on 3/27. PMH: unremarkable    PT Comments    Pt slowly progressing towards all goals. Pt remains to have R thigh swelling requiring lots of AA R LE exercises prior to pt being able to complete exercises on own. Pt began ambulation today and was able to clear foot today but initially required modA to advance LE. Pt with improved R active DF and quad set today as well. Acute PT to cont to follow.    Follow Up Recommendations  Home health PT;Supervision/Assistance - 24 hour     Equipment Recommendations  Rolling walker with 5" wheels    Recommendations for Other Services       Precautions / Restrictions Precautions Precautions: Fall Precaution Comments: R LE numb Restrictions Weight Bearing Restrictions: Yes RLE Weight Bearing: Weight bearing as tolerated    Mobility  Bed Mobility Overal bed mobility: Needs Assistance Bed Mobility: Supine to Sit     Supine to sit: Min assist     General bed mobility comments: minA for R LE management, unable to hold up/slowly lower to ground on own    Transfers Overall transfer level: Needs assistance Equipment used: Rolling walker (2 wheeled) Transfers: Sit to/from [employer] Sit to Stand: Min assist         General transfer comment: pt with improved transfer via pushing up from bed today. Pt with minimal R LE WBing initially but able to adjust in standign  Ambulation/Gait Ambulation/Gait assistance: Min assist;Mod assist Gait Distance (Feet): 20 Feet Assistive device: Rolling walker (2 wheeled) Gait Pattern/deviations: Step-to pattern;Decreased step length - right;Decreased stance time - right;Decreased dorsiflexion -  right Gait velocity: slow Gait velocity interpretation: <1.31 ft/sec, indicative of household ambulator General Gait Details: pt initially requiring modA to advance R LE however was able to advance without assist with increased distance, pt with minimal WBing tolerance on R LE, pt fatigues quickly   Stairs             Wheelchair Mobility    Modified Rankin (Stroke Patients Only)       Balance Overall balance assessment: Needs assistance Sitting-balance support: No upper extremity supported;Feet supported Sitting balance-Leahy Scale: Fair     Standing balance support: Bilateral upper extremity supported;During functional activity Standing balance-Leahy Scale: Poor Standing balance comment: dependent on bilat UEs and RW                            Cognition Arousal/Alertness: Awake/alert Behavior During Therapy: WFL for tasks assessed/performed Overall Cognitive Status: Within Functional Limits for tasks assessed                                        Exercises General Exercises - Lower Extremity Ankle Circles/Pumps: AAROM;Right;10 reps;Supine Quad Sets: AROM;Right;10 reps;Supine Gluteal Sets: AROM;Right;10 reps;Supine Long Arc Quad: AAROM;Right;10 reps;Seated Other Exercises Other Exercises: focused on seated knee flexion AA and active with R foot on washcloth for increase ease of heel slide, instructed pt on how to use L heel to aide in anterior R ankle to help push  foot back to increased R knee flexion/stretch    General Comments General comments (skin integrity, edema, etc.): R thigh with noted swelling, tightness      Pertinent Vitals/Pain Pain Assessment: 0-10 Pain Score: 7  Pain Location: R LE Pain Descriptors / Indicators: Aching;Discomfort;Sharp Pain Intervention(s): Limited activity within patient's tolerance    Home Living                      Prior Function            PT Goals (current goals can now be  found in the care plan section) Acute Rehab PT Goals Patient Stated Goal: get feeling back in my leg and pain better PT Goal Formulation: With patient Time For Goal Achievement: 03/26/21 Potential to Achieve Goals: Good Progress towards PT goals: Progressing toward goals    Frequency    Min 4X/week      PT Plan Current plan remains appropriate    Co-evaluation              AM-PAC PT "6 Clicks" Mobility   Outcome Measure  Help needed turning from your back to your side while in a flat bed without using bedrails?: A Little Help needed moving from lying on your back to sitting on the side of a flat bed without using bedrails?: A Little Help needed moving to and from a bed to a chair (including a wheelchair)?: A Little Help needed standing up from a chair using your arms (e.g., wheelchair or bedside chair)?: A Little Help needed to walk in hospital room?: A Lot Help needed climbing 3-5 steps with a railing? : A Lot 6 Click Score: 16    End of Session Equipment Utilized During Treatment: Gait belt Activity Tolerance: Patient limited by pain Patient left: in chair;with call bell/phone within reach;with chair alarm set Nurse Communication: Mobility status;Patient requests pain meds PT Visit Diagnosis: Muscle weakness (generalized) (M62.81);Difficulty in walking, not elsewhere classified (R26.2);Pain Pain - Right/Left: Right Pain - part of body: Leg     Time: 1610-9604 PT Time Calculation (min) (ACUTE ONLY): 39 min  Charges:  $Gait Training: 8-22 mins $Therapeutic Exercise: 23-37 mins                     Lewis Shock, PT, DPT Acute Rehabilitation Services Pager #: [phone] Office #: [phone]    Iona Hansen 03/13/2021, 2:21 PM

## 2021-03-13 NOTE — Progress Notes (Signed)
Orthopaedic Trauma Progress Note  SUBJECTIVE: Reports minimal pain pain about operative site at rest.  Had some increased pain with therapies yesterday but pain medications are now helping.  No chest pain. No SOB. No nausea/vomiting. No other complaints.  Reports that swelling in thigh is stable.  OBJECTIVE:  Vitals:   03/13/21 0730 03/13/21 0730  BP: 123/68 123/68  Pulse: 97 93  Resp: 17 17  Temp: 98.8 F (37.1 C) 98.8 F (37.1 C)  SpO2: 98% 100%    General: Sitting up in bed, NAD Respiratory: No increased work of breathing.  Right Lower Extremity: Dressing removed, incisions are CDI.  Swelling to the thigh as expected, stable from previous exam.  Tenderness over the lateral hip and thigh.  No significant tenderness anterior, medial, posterior thigh.endorses discomfort with flexion of the knee.  Ankle DF/PF intact. +EHL. +FHL.  Thigh swollen but compartments are compressible throughout.  Skin warm and dry.  +DP pulse   IMAGING: Stable post op imaging.   LABS:  Results for orders placed or performed during the hospital encounter of 03/10/21 (from the past 24 hour(s))  CBC     Status: Abnormal   Collection Time: 03/12/21  8:02 AM  Result Value Ref Range   WBC 9.3 4.0 - 10.5 K/uL   RBC 3.13 (L) 4.22 - 5.81 MIL/uL   Hemoglobin 7.3 (L) 13.0 - 17.0 g/dL   HCT 43.3 (L) 29.5 - 18.8 %   MCV 69.0 (L) 80.0 - 100.0 fL   MCH 23.3 (L) 26.0 - 34.0 pg   MCHC 33.8 30.0 - 36.0 g/dL   RDW 41.6 60.6 - 30.1 %   Platelets 147 (L) 150 - 400 K/uL   nRBC 0.0 0.0 - 0.2 %  CBC     Status: Abnormal   Collection Time: 03/12/21  3:07 PM  Result Value Ref Range   WBC 10.6 (H) 4.0 - 10.5 K/uL   RBC 2.88 (L) 4.22 - 5.81 MIL/uL   Hemoglobin 6.7 (LL) 13.0 - 17.0 g/dL   HCT 60.1 (L) 09.3 - 23.5 %   MCV 70.1 (L) 80.0 - 100.0 fL   MCH 23.3 (L) 26.0 - 34.0 pg   MCHC 33.2 30.0 - 36.0 g/dL   RDW 57.3 22.0 - 25.4 %   Platelets 141 (L) 150 - 400 K/uL   nRBC 0.0 0.0 - 0.2 %  Prepare RBC (crossmatch)      Status: None   Collection Time: 03/12/21  4:30 PM  Result Value Ref Range   Order Confirmation      ORDER PROCESSED BY BLOOD BANK Performed at [facility], [address], [place], [place] [address]   Basic metabolic panel     Status: Abnormal   Collection Time: 03/12/21 10:16 PM  Result Value Ref Range   Sodium 129 (L) 135 - 145 mmol/L   Potassium 3.5 3.5 - 5.1 mmol/L   Chloride 99 98 - 111 mmol/L   CO2 26 22 - 32 mmol/L   Glucose, Bld 130 (H) 70 - 99 mg/dL   BUN 6 6 - 20 mg/dL   Creatinine, Ser 3.76 0.61 - 1.24 mg/dL   Calcium 8.4 (L) 8.9 - 10.3 mg/dL   GFR, Estimated >28 >31 mL/min   Anion gap 4 (L) 5 - 15  CBC     Status: Abnormal   Collection Time: 03/12/21 10:16 PM  Result Value Ref Range   WBC 12.5 (H) 4.0 - 10.5 K/uL   RBC 3.35 (L) 4.22 -  5.81 MIL/uL   Hemoglobin 8.1 (L) 13.0 - 17.0 g/dL   HCT 70.3 (L) 50.0 - 93.8 %   MCV 73.1 (L) 80.0 - 100.0 fL   MCH 24.2 (L) 26.0 - 34.0 pg   MCHC 33.1 30.0 - 36.0 g/dL   RDW 18.2 (H) 99.3 - 71.6 %   Platelets 155 150 - 400 K/uL   nRBC 0.0 0.0 - 0.2 %  CBC with Differential/Platelet     Status: Abnormal   Collection Time: 03/13/21  4:05 AM  Result Value Ref Range   WBC 13.4 (H) 4.0 - 10.5 K/uL   RBC 3.63 (L) 4.22 - 5.81 MIL/uL   Hemoglobin 8.9 (L) 13.0 - 17.0 g/dL   HCT 96.7 (L) 89.3 - 81.0 %   MCV 73.3 (L) 80.0 - 100.0 fL   MCH 24.5 (L) 26.0 - 34.0 pg   MCHC 33.5 30.0 - 36.0 g/dL   RDW 17.5 (H) 10.2 - 58.5 %   Platelets 147 (L) 150 - 400 K/uL   nRBC 0.0 0.0 - 0.2 %   Neutrophils Relative % 65 %   Neutro Abs 8.6 (H) 1.7 - 7.7 K/uL   Lymphocytes Relative 19 %   Lymphs Abs 2.5 0.7 - 4.0 K/uL   Monocytes Relative 15 %   Monocytes Absolute 2.1 (H) 0.1 - 1.0 K/uL   Eosinophils Relative 1 %   Eosinophils Absolute 0.2 0.0 - 0.5 K/uL   Basophils Relative 0 %   Basophils Absolute 0.0 0.0 - 0.1 K/uL   Immature Granulocytes 0 %   Abs Immature Granulocytes 0.06 0.00 - 0.07 K/uL    ASSESSMENT: Martin Williams is a 55 y.o.  male, 2 Days Post-Op  s/p INTRAMEDULLARY NAIL RIGHT INTERTROCHANTRIC FEMUR FRACTURE  CV/Blood loss: Acute blood loss anemia, Hgb 8.9 this morning.  Received 1 unit PRBCs for hemoglobin of 6.7 yesterday 03/12/2021  PLAN: Weightbearing: WBAT RLE Incisional and dressing care: Okay to leave incisions open to air Showering: Ok to begin showering with assistance 03/14/21 if no drainage from incisions Orthopedic device(s): None  Pain management:  1. Tylenol 325-650 mg q 6 hours PRN 2. Robaxin 500 mg q 6 hours PRN 3. Norco 5-325 mg q 4 hours PRN moderate pain 4. Norco 7.5-325 mg q 4 hours PRN severe pain 5. Morphine 0.5-1 mg q 2 hours PRN breakthrough pain VTE prophylaxis: Ok to start Lovenox from ortho standpoint, SCDs ID:  Ancef 2gm post op completed Foley/Lines:  No foley, KVO IVFs Impediments to Fracture Healing: Vit D level 8, start on D3 and D2 supplementation Dispo: Therapies as tolerated, recommending home health PT.  No OT follow-up recommended.  Will place orders for recommended DME.  Continue to monitor CBC.  We will plan to transition to Eliquis for DVT prophylaxis at discharge. Follow - up plan: 2 weeks after d/c for repeat x-rays and wound check   Contact information:  Truitt Merle MD, Ulyses Southward PA-C. After hours and holidays please check [email or web address] for group call information for [facility]   Martin Forrest A. Michaelyn Barter, PA-C [phone] (office) [email or web address]

## 2021-03-14 DIAGNOSIS — W19XXXA Unspecified fall, initial encounter: Secondary | ICD-10-CM

## 2021-03-14 DIAGNOSIS — S72001D Fracture of unspecified part of neck of right femur, subsequent encounter for closed fracture with routine healing: Secondary | ICD-10-CM

## 2021-03-14 LAB — HEMOGLOBIN AND HEMATOCRIT, BLOOD
HCT: 22 % — ABNORMAL LOW (ref 39.0–52.0)
HCT: 23.1 % — ABNORMAL LOW (ref 39.0–52.0)
Hemoglobin: 7.1 g/dL — ABNORMAL LOW (ref 13.0–17.0)
Hemoglobin: 7.6 g/dL — ABNORMAL LOW (ref 13.0–17.0)

## 2021-03-14 LAB — BASIC METABOLIC PANEL
Anion gap: 10 (ref 5–15)
BUN: 5 mg/dL — ABNORMAL LOW (ref 6–20)
CO2: 25 mmol/L (ref 22–32)
Calcium: 8.8 mg/dL — ABNORMAL LOW (ref 8.9–10.3)
Chloride: 95 mmol/L — ABNORMAL LOW (ref 98–111)
Creatinine, Ser: 0.65 mg/dL (ref 0.61–1.24)
GFR, Estimated: >60 mL/min (ref >60)
Glucose, Bld: 81 mg/dL (ref 70–99)
Potassium: 3.8 mmol/L (ref 3.5–5.1)
Sodium: 130 mmol/L — ABNORMAL LOW (ref 135–145)

## 2021-03-14 NOTE — Progress Notes (Signed)
Physical Therapy Treatment Patient Details Name: Martin Williams MRN: [id number] DOB: Jul 12, 1966 Today's Date: 03/14/2021    History of Present Illness Pt is a 56 yo male who slipped at work on a wet floor and sustained a R displaced intertrochanteric fx who then underwent an IM nail to R hip on 3/27. PMH: unremarkable    PT Comments    Pt making slow progress towards all goals. Pt continues with R LE weakness, R thigh swelling, and limited WBing tolerance. Pt with increased ambulation tolerance today and ability to activate hip flexion in sitting. Worked on hip flexion exercises to improve pt ability to clear R foot during ambulation. Pt with good home set up and support. Will need RW and HHPT to go home safely in addition to 24/7 assist. Acute PT to cont to follow.    Follow Up Recommendations  Home health PT;Supervision/Assistance - 24 hour     Equipment Recommendations  Rolling walker with 5" wheels    Recommendations for Other Services       Precautions / Restrictions Precautions Precautions: Fall Precaution Comments: R LE numb Restrictions Weight Bearing Restrictions: Yes RLE Weight Bearing: Weight bearing as tolerated    Mobility  Bed Mobility Overal bed mobility: Needs Assistance Bed Mobility: Supine to Sit     Supine to sit: HOB elevated;Supervision     General bed mobility comments: pt up in recliner    Transfers Overall transfer level: Needs assistance Equipment used: Rolling walker (2 wheeled) Transfers: Sit to/from [employer] Sit to Stand: Min guard;From elevated surface         General transfer comment: pt with good hand placement, no physical assist needed, just increased time  Ambulation/Gait Ambulation/Gait assistance: Min guard Gait Distance (Feet): 40 Feet Assistive device: Rolling walker (2 wheeled) Gait Pattern/deviations: Step-to pattern;Decreased step length - right;Decreased stance time - right;Decreased dorsiflexion -  right Gait velocity: slow   General Gait Details: pt able to clear R  foot majority of time, with onset of fatigue pt began to use R UE to advance R LE, pt instructed why that was unsafe and not to do it   Social research officer, government [employer] (Stroke Patients Only)       Balance Overall balance assessment: Needs assistance Sitting-balance support: No upper extremity supported;Feet supported Sitting balance-Leahy Scale: Good     Standing balance support: Bilateral upper extremity supported;During functional activity Standing balance-Leahy Scale: Poor Standing balance comment: reliant on external support                            Cognition Arousal/Alertness: Awake/alert Behavior During Therapy: WFL for tasks assessed/performed Overall Cognitive Status: Within Functional Limits for tasks assessed                                        Exercises General Exercises - Lower Extremity Ankle Circles/Pumps: AAROM;Right;10 reps;Supine Quad Sets: AROM;Right;10 reps;Supine Gluteal Sets: AROM;Right;10 reps;Supine Long Arc Quad: AAROM;Right;10 reps;Seated Heel Slides: AAROM;Right;10 reps;Sidelying (heel slide with wash cloth to promote increased knee flexion)    General Comments General comments (skin integrity, edema, etc.): pt continues with R thigh swelling, ice applied      Pertinent Vitals/Pain Pain Assessment: 0-10 Pain Score: 5  Pain Location: R LE Pain  Descriptors / Indicators: Constant Pain Intervention(s): Monitored during session    Home Living                      Prior Function            PT Goals (current goals can now be found in the care plan section) Acute Rehab PT Goals Patient Stated Goal: go home soon PT Goal Formulation: With patient Time For Goal Achievement: 03/26/21 Potential to Achieve Goals: Good Progress towards PT goals: Progressing toward goals    Frequency    Min  4X/week      PT Plan Current plan remains appropriate    Co-evaluation              AM-PAC PT "6 Clicks" Mobility   Outcome Measure  Help needed turning from your back to your side while in a flat bed without using bedrails?: A Little Help needed moving from lying on your back to sitting on the side of a flat bed without using bedrails?: A Little Help needed moving to and from a bed to a chair (including a wheelchair)?: A Little Help needed standing up from a chair using your arms (e.g., wheelchair or bedside chair)?: A Little Help needed to walk in hospital room?: A Lot Help needed climbing 3-5 steps with a railing? : A Lot 6 Click Score: 16    End of Session Equipment Utilized During Treatment: Gait belt Activity Tolerance: Patient limited by pain Patient left: in chair;with call bell/phone within reach;with chair alarm set Nurse Communication: Mobility status;Patient requests pain meds PT Visit Diagnosis: Muscle weakness (generalized) (M62.81);Difficulty in walking, not elsewhere classified (R26.2);Pain Pain - Right/Left: Right Pain - part of body: Leg     Time: 3557-3220 PT Time Calculation (min) (ACUTE ONLY): 17 min  Charges:  $Gait Training: 8-22 mins                     Martin Williams, PT, DPT Acute Rehabilitation Services Pager #: [phone] Office #: [phone]    Martin Williams 03/14/2021, 1:27 PM

## 2021-03-14 NOTE — Progress Notes (Signed)
Occupational Therapy Treatment Patient Details Name: Martin Williams MRN: [id number] DOB: Nov 19, 1966 Today's Date: 03/14/2021    History of present illness Pt is a 55 yo male who slipped at work on a wet floor and sustained a R displaced intertrochanteric fx who then underwent an IM nail to R hip on 3/27. PMH: unremarkable   OT comments  Pt making steady progress. Issued AE to help pt with LB ADL. Pt able to return demonstrate and will have assistance by family as needed.Able to ambulate to bathroom, however requires min A due to trying to let go of his walker to advance his RLE. Educated pt on compensatory strategies for safer gait pattern. Discussed need for him to be able to ambulate without letting go of the RW in order to safely DC home. Discussed with PT. Will continue to follow acutely.   Follow Up Recommendations  No OT follow up;Supervision - Intermittent    Equipment Recommendations  3 in 1 bedside commode    Recommendations for Other Services      Precautions / Restrictions Precautions Precautions: Fall Restrictions RLE Weight Bearing: Weight bearing as tolerated       Mobility Bed Mobility Overal bed mobility: Needs Assistance Bed Mobility: Supine to Sit     Supine to sit: HOB elevated;Supervision     General bed mobility comments: Pt sleeps in recliner at home    Transfers Overall transfer level: Needs assistance Equipment used: Rolling walker (2 wheeled) Transfers: Sit to/from [employer] Sit to Stand: Min guard;From elevated surface         General transfer comment: improved as session progressed    Balance Overall balance assessment: Needs assistance   Sitting balance-Leahy Scale: Good       Standing balance-Leahy Scale: Poor Standing balance comment: reliant on external support                           ADL either performed or assessed with clinical judgement   ADL Overall ADL's : Needs assistance/impaired              Lower Body Bathing: Minimal assistance;Sit to/from stand   Upper Body Dressing : Set up;Sitting   Lower Body Dressing: Minimal assistance;Sit to/from stand   Toilet Transfer: Minimal assistance;RW;Ambulation;BSC   Toileting- Clothing Manipulation and Hygiene: Minimal assistance;Sit to/from stand       Functional mobility during ADLs: Minimal assistance;Rolling walker;Cueing for safety;Cueing for sequencing General ADL Comments: Educated pt on use of AE (long handled sponge and reacher) to assist wtih ADL tasks. Pt states family can also assist however he would like to be able to bath himself and put on his pants/underwear. Pt benefits from 3in1 over toilet to increase abilty to safely trasnfer on/off toilet. Otherwise pt pulls/relies heavily on grab bar.     Vision       Perception     Praxis      Cognition Arousal/Alertness: Awake/alert Behavior During Therapy: WFL for tasks assessed/performed Overall Cognitive Status: Within Functional Limits for tasks assessed                                          Exercises     Shoulder Instructions       General Comments Pt haivng difficulty advancing RLE when ambulating. Educated pt on importance of not reaching to pull RLE  through using his R arm as this significantly increases his risk of falls. Pt educated on compensatory pattern to work toward more normal movement pattern. Educated on home safety and strategies to reduce risk of falls    Pertinent Vitals/ Pain       Pain Assessment: 0-10 Pain Score: 4  Pain Location: R LE Pain Descriptors / Indicators: Constant Pain Intervention(s): Limited activity within patient's tolerance;Repositioned;RN gave pain meds during session; ice  Home Living                                          Prior Functioning/Environment              Frequency  Min 2X/week        Progress Toward Goals  OT Goals(current goals can now be found  in the care plan section)  Progress towards OT goals: Progressing toward goals  Acute Rehab OT Goals Patient Stated Goal: go home soon OT Goal Formulation: With patient Time For Goal Achievement: 03/26/21 Potential to Achieve Goals: Good ADL Goals Pt Will Perform Grooming: with modified independence;standing Pt Will Perform Upper Body Bathing: Independently;sitting Pt Will Perform Lower Body Bathing: with modified independence;with adaptive equipment;sit to/from stand Pt Will Perform Upper Body Dressing: Independently;sitting Pt Will Perform Lower Body Dressing: with modified independence;sit to/from stand;with adaptive equipment Pt Will Transfer to Toilet: with modified independence;ambulating;bedside commode Pt Will Perform Toileting - Clothing Manipulation and hygiene: with modified independence;sit to/from stand Pt Will Perform Tub/Shower Transfer: Shower transfer;with modified independence;ambulating;tub bench;rolling walker Additional ADL Goal #1: Pt will be Mod I in and OOB for basic ADLs (no rail, HOB flat, increased time)  Plan Discharge plan remains appropriate    Co-evaluation                 AM-PAC OT "6 Clicks" Daily Activity     Outcome Measure   Help from another person eating meals?: None Help from another person taking care of personal grooming?: A Little Help from another person toileting, which includes using toliet, bedpan, or urinal?: A Little Help from another person bathing (including washing, rinsing, drying)?: A Little Help from another person to put on and taking off regular upper body clothing?: A Little Help from another person to put on and taking off regular lower body clothing?: A Little 6 Click Score: 19    End of Session Equipment Utilized During Treatment: Gait belt;Rolling walker  OT Visit Diagnosis: Unsteadiness on feet (R26.81);Other abnormalities of gait and mobility (R26.89);Muscle weakness (generalized) (M62.81);Pain Pain -  Right/Left: Right Pain - part of body: Leg   Activity Tolerance Patient tolerated treatment well   Patient Left in chair;with call bell/phone within reach;with chair alarm set   Nurse Communication Mobility status;Weight bearing status;Precautions        Time: 4270-6237 OT Time Calculation (min): 38 min  Charges: OT General Charges $OT Visit: 1 Visit OT Treatments $Self Care/Home Management : 38-52 mins  Luisa Dago, OT/L   Acute OT Clinical Specialist Acute Rehabilitation Services Pager [phone] Office [phone]    [facility] 03/14/2021, 10:54 AM

## 2021-03-14 NOTE — Plan of Care (Signed)

## 2021-03-14 NOTE — Discharge Summary (Addendum)
Family Medicine Teaching [facility] Discharge Summary  Patient name: Martin Williams Medical record number: [id number] Date of birth: [birth year] Age: 55 y.o. Gender: male Date of Admission: 03/10/2021  Date of Discharge: 03/16/2021 Admitting Physician: Leeroy Bock, DO  Primary Care Provider: Patient, No Pcp Per (Inactive) Consultants: Orthopedics    Indication for Hospitalization: R hip fracture   Discharge Diagnoses/Problem List:  Right hip fracture  Acute blood loss anemia  Hyponatremia  Hypokalemia  Vitamin D deficiency   Disposition: Home   Discharge Condition: Stable   Discharge Exam:  Temp:  [98.9 F (37.2 C)-99.1 F (37.3 C)] 99.1 F (37.3 C) (04/01 0642) Pulse Rate:  [81-96] 81 (03/31 2218) Resp:  [16-18] 18 (04/01 0642) BP: (113-123)/(70-83) 118/74 (04/01 0642) SpO2:  [98 %-100 %] 98 % (04/01 9509)  Physical Exam: General: Patient sitting upright in bed, in no acute distress. Cardiovascular: RRR, no murmurs or gallops auscultated Respiratory: CTAB, no wheezing, rales or rhonchi noted  Abdomen: soft, nontender, nondistended, presence of active BS Extremities: radial and distal pulses strong and equal bilaterally, no LE edema noted bilaterally Derm: 4 cm healing scar along right lateral hip s/p ORIF, no drainage or bleeding noted, no other rashes or lesions noted Neuro: gross sensation intact, CN 2-12 grossly intact, appropriately conversational Psych: mood appropriate     Brief Hospital Course:   Martin Williams is a 55 y.o. male presenting with R hip fx. PMH significant for tobacco use.   Right intertrochanteric fracture s/p ORIF 3/27 Patient admitted after mechanical fall, found to have a displaced right intertrochanteric fracture. Orthopedic surgery was consulted and patient underwent ORIF on 3/27. Patient worked with PT/OT and was able to ambulate prior to discharge. Patient was cautiously started on Lovenox for DVT prophylaxis given recent acute  blood loss anemia and was transitioned to Eliquis prior to discharge.   Acute blood loss anemia Hemoglobin on 3/28 decreased from 8.8 on admission to 6.7. Patient was transfused 1U PRBC on 3/28 with subsequent appropriate rise in hemoglobin. Eliquis restarted 3/31 hemoglobin remained stable at 7.2 prior to discharge. Eliquis continued on discharge, recommended to continue for 30 days per orthopedics.   Thrombocytopenia Patient platelets mildly decreased at 147 prior to discharge, suspected secondary to nutritional causes.   Vitamin D deficiency, concern for osteoporosis  Due to hip fracture from a fall at standing height, there was concern for possible underlying bone pathology. Calcium level was obtained and was mildly low at 8.3, at discharge improved to 8.8. As patient had a fracture at his age from a low height fall, it was concerning for a pathologic fracture. Patient was started on D3 and D2 supplementation prior to discharge as this is a barrier to healing of the fracture.   Issues for follow-up Follow - up plan per Ortho: 2 weeks outpatient f/u after d/c for repeat x-rays and wound check Repeat CBC in 1 week to monitor Hgb and platelets Further outpatient evaluation of microcytic anemia, recommend another CBC in 3 months after oral iron therapy. Evaluate need for bisphosphonate therapy given pathologic fracture   Significant Procedures: Open reduction and internal fixation of R hip   Significant Labs and Imaging:  Recent Labs  Lab 03/13/21 0405 03/13/21 1435 03/15/21 0227 03/15/21 1507 03/16/21 0614  WBC 13.4*  --  7.0  --  8.2  HGB 8.9*   < > 7.0* 7.2* 7.2*  HCT 26.6*   < > 21.2* 22.2* 22.1*  PLT 147*  --  201  --  248   < > = values in this interval not displayed.   Recent Labs  Lab 03/10/21 2226 03/11/21 1432 03/12/21 0454 03/12/21 2216 03/14/21 0515  NA 131* 132* 129* 129* 130*  K 3.6 3.5 3.7 3.5 3.8  CL 102 100 99 99 95*  CO2 23 24 24 26 25   GLUCOSE 109* 114*  147* 130* 81  BUN 6 5* <5* 6 5*  CREATININE 0.74 0.71 0.67 0.80 0.65  CALCIUM 8.9 8.4* 8.3* 8.4* 8.8*  ALKPHOS 61  --   --   --   --   AST 34  --   --   --   --   ALT 15  --   --   --   --   ALBUMIN 4.1  --   --   --   --     Results/Tests Pending at Time of Discharge: None   Discharge Medications:  Allergies as of 03/16/2021   No Known Allergies      Medication List     TAKE these medications    acetaminophen 500 MG tablet Commonly known as: TYLENOL Take 1,000 mg by mouth every 6 (six) hours as needed for mild pain.   apixaban 2.5 MG Tabs tablet Commonly known as: ELIQUIS Take 1 tablet (2.5 mg total) by mouth 2 (two) times daily.   ferrous sulfate 325 (65 FE) MG tablet Take 1 tablet (325 mg total) by mouth daily.   HYDROcodone-acetaminophen 5-325 MG tablet Commonly known as: NORCO/VICODIN Take 1-2 tablets by mouth every 4 (four) hours as needed for moderate pain (pain score 4-6).   polyethylene glycol 17 g packet Commonly known as: MIRALAX / GLYCOLAX Take 17 g by mouth daily as needed for mild constipation.   Vitamin D (Ergocalciferol) 1.25 MG (50000 UNIT) Caps capsule Commonly known as: DRISDOL Take 1 capsule (50,000 Units total) by mouth every Tuesday. Start taking on: March 20, 2021   Vitamin D3 25 MCG tablet Commonly known as: Vitamin D Take 2 tablets (2,000 Units total) by mouth daily. Start taking on: March 17, 2021        Discharge Instructions: Please refer to Patient Instructions section of EMR for full details.  Patient was counseled important signs and symptoms that should prompt return to medical care, changes in medications, dietary instructions, activity restrictions, and follow up appointments.   Follow-Up Appointments:  Follow-up Information     Hadd[place], March 19, 2021, MD. Schedule an appointment as soon as possible for a visit.   Specialty: Orthopedic Surgery Contact information: [address] [place] [phone]          [facility] Follow up in 1 week(s).   Specialty: Primary Care Why: will need to fill out application via website or pickup at office.  Contact information: [address] [place] [phone]                [phone], MD 03/16/2021, 7:39 PM PGY-1, [facility] Family [facility]

## 2021-03-14 NOTE — Discharge Instructions (Signed)
Dear Martin Williams,   Thank you so much for allowing [place] to be part of your care! You were admitted to the hospital for a broken hip. You had a surgery to repair this fracture. You also received a blood transfusion for low blood count. To prevent a blood clot, which can happen after a major surgery, we recommend you use eliquis, a blood thinner, twice a day for 3 months. Please follow up with Dr. Jena Gauss  2 weeks after discharge for repeat x-rays and wound check. You will continue physical therapy at home.   If you need help with workman's compensation you can contact the legal aid justice center at [phone]   POST-HOSPITAL & CARE INSTRUCTIONS 1. Please take the medication as listed on your paperwork  2. Please let PCP/Specialists know of any changes that were made.  3. Please see medications section of this packet for any medication changes.   DOCTOR'S APPOINTMENT & FOLLOW UP CARE INSTRUCTIONS  No future appointments.  RETURN PRECAUTIONS: Return if you develop concern for infection: fever, difficulty breathing, acute worsening of your leg pain.   Take care and be well!  Family Medicine Teaching Service Inpatient Team [place]  [facility]  [address] [place], [place] [address] [phone]

## 2021-03-14 NOTE — Progress Notes (Signed)
Family Medicine Teaching Service Daily Progress Note Intern Pager: [phone]  Patient name: Martin Williams Medical record number: [id number] Date of birth: 25-Sep-1966 Age: 55 y.o. Gender: male  Primary Care Provider: Patient, No Pcp Per (Inactive) Consultants: Ortho Code Status: Full   Pt Overview and Major Events to Date:  3/27 Admitted, ORIF  3/28 Transfused 1u pRBC   Assessment and Plan: Martin Williams is a 55 y.o. male presenting with R hip fx. PMH is significant for none.  R hip fx s/p ORIF R lateral thigh edematous though improving from yesterday - Ortho following, appreciate continued care and recommendations               - pain regimen per Ortho              - d/c today pending H&H  - PT/OT  Mild microcytic anemia  Acute blood loss anemia  Hgb dropped to 7.1 from 8.0 yesterday - f/u H&H this afternoon - transfuse if below 7  Hyponatremia mild. Na 131 on admission and appears chronic. 130 currently  - am BMP   Hypokalemia Vit D level 8  - 2000 units daily, 50,000 units weekly per Ortho    FEN/GI:  Prophylaxis: SCD, lovenox   Disposition: Home likely in next 24-48h pending ambulation   Subjective:  No acute events overnight. Patient feeling improved, was able to tolerate PT yesterday. Would like to be discharged if possible   Objective: Temp:  [98.1 F (36.7 C)-98.8 F (37.1 C)] 98.7 F (37.1 C) (03/30 0523) Pulse Rate:  [93-100] 100 (03/30 0523) Resp:  [17-18] 18 (03/30 0523) BP: (119-139)/(68-85) 119/77 (03/30 0523) SpO2:  [97 %-100 %] 99 % (03/30 0523) Physical Exam: General: alert, pleasant, NAD Cardiovascular: RRR no murmurs  Respiratory: CTAB normal WOB  Abdomen: soft, non distended Extremities: warm, dry. SCDs in place. R thigh edematous non erythematous. Incision sites healing well without erythema or drainage   Laboratory: Recent Labs  Lab 03/12/21 1507 03/12/21 2216 03/13/21 0405 03/13/21 1435  WBC 10.6* 12.5* 13.4*  --   HGB  6.7* 8.1* 8.9* 8.0*  HCT 20.2* 24.5* 26.6* 23.8*  PLT 141* 155 147*  --    Recent Labs  Lab 03/10/21 2226 03/11/21 1432 03/12/21 0454 03/12/21 2216  NA 131* 132* 129* 129*  K 3.6 3.5 3.7 3.5  CL 102 100 99 99  CO2 23 24 24 26   BUN 6 5* <5* 6  CREATININE 0.74 0.71 0.67 0.80  CALCIUM 8.9 8.4* 8.3* 8.4*  PROT 7.3  --   --   --   BILITOT 0.4  --   --   --   ALKPHOS 61  --   --   --   ALT 15  --   --   --   AST 34  --   --   --   GLUCOSE 109* 114* 147* 130*    Imaging/Diagnostic Tests: None new  , DO 03/14/2021, 7:16 AM PGY-1, [facility] Family Medicine FPTS Intern pager: [phone], text pages welcome

## 2021-03-15 LAB — CBC
HCT: 21.2 % — ABNORMAL LOW (ref 39.0–52.0)
Hemoglobin: 7 g/dL — ABNORMAL LOW (ref 13.0–17.0)
MCH: 24.1 pg — ABNORMAL LOW (ref 26.0–34.0)
MCHC: 33 g/dL (ref 30.0–36.0)
MCV: 72.9 fL — ABNORMAL LOW (ref 80.0–100.0)
Platelets: 201 10*3/uL (ref 150–400)
RBC: 2.91 MIL/uL — ABNORMAL LOW (ref 4.22–5.81)
RDW: 17.1 % — ABNORMAL HIGH (ref 11.5–15.5)
WBC: 7 10*3/uL (ref 4.0–10.5)
nRBC: 0 % (ref 0.0–0.2)

## 2021-03-15 LAB — HEMOGLOBIN AND HEMATOCRIT, BLOOD
HCT: 22.2 % — ABNORMAL LOW (ref 39.0–52.0)
Hemoglobin: 7.2 g/dL — ABNORMAL LOW (ref 13.0–17.0)

## 2021-03-15 MED ORDER — APIXABAN 2.5 MG PO TABS
2.5000 mg | ORAL_TABLET | Freq: Two times a day (BID) | ORAL | Status: DC
  Administered 2021-03-15 – 2021-03-16 (×2): 2.5 mg via ORAL
  Filled 2021-03-15 (×2): qty 1

## 2021-03-15 NOTE — Progress Notes (Signed)
Physical Therapy Treatment Patient Details Name: Martin Williams MRN: [id number] DOB: Feb 24, 1966 Today's Date: 03/15/2021    History of Present Illness Pt is a 55 yo male who slipped at work on a wet floor and sustained a R displaced intertrochanteric fx who then underwent an IM nail to R hip on 3/27. PMH: unremarkable    PT Comments    Pt was assisted to move RLE on bed with ROM to hip and knee, and was able to tolerate fairly well but asking for pain meds.  Set up up with ice for pain on R thigh, and after was able to rest comfortably.  Follow for goals of acute PT and encourage OOB, sitting balance skills, strengthening to BLE's and standing on RLE as tolerated.  Pt is currently painful and declining to sit up but is also talking about trying to move alone.  Follow for safety education and stability of balance and strength to increase independent gait and transfers.   Follow Up Recommendations  Home health PT;Supervision/Assistance - 24 hour     Equipment Recommendations  Rolling walker with 5" wheels    Recommendations for Other Services       Precautions / Restrictions Precautions Precautions: Fall Restrictions Weight Bearing Restrictions: Yes RLE Weight Bearing: Weight bearing as tolerated    Mobility  Bed Mobility Overal bed mobility: Needs Assistance             General bed mobility comments: scooting up in bed with supervision    Transfers                 General transfer comment: declined OOB  Ambulation/Gait                 Stairs             Wheelchair Mobility    Modified Rankin (Stroke Patients Only)       Balance Overall balance assessment: Needs assistance                                          Cognition Arousal/Alertness: Awake/alert Behavior During Therapy: WFL for tasks assessed/performed Overall Cognitive Status: Within Functional Limits for tasks assessed                                         Exercises General Exercises - Lower Extremity Ankle Circles/Pumps: AROM;AAROM;5 reps Quad Sets: AROM;10 reps Gluteal Sets: AROM;10 reps Long Arc Quad: AROM;10 reps Heel Slides: AROM;10 reps Hip ABduction/ADduction: AROM;AAROM;10 reps Straight Leg Raises: AROM;AAROM;10 reps    General Comments        Pertinent Vitals/Pain Pain Assessment: 0-10 Pain Score: 6  Pain Location: R LE Pain Descriptors / Indicators: Grimacing Pain Intervention(s): Limited activity within patient's tolerance;Monitored during session;Premedicated before session;Repositioned;Ice applied    Home Living                      Prior Function            PT Goals (current goals can now be found in the care plan section) Acute Rehab PT Goals Patient Stated Goal: go home soon Progress towards PT goals: Progressing toward goals    Frequency    Min 4X/week      PT Plan Current plan remains  appropriate    Co-evaluation              AM-PAC PT "6 Clicks" Mobility   Outcome Measure  Help needed turning from your back to your side while in a flat bed without using bedrails?: A Little Help needed moving from lying on your back to sitting on the side of a flat bed without using bedrails?: A Little Help needed moving to and from a bed to a chair (including a wheelchair)?: A Little Help needed standing up from a chair using your arms (e.g., wheelchair or bedside chair)?: A Little Help needed to walk in hospital room?: A Lot Help needed climbing 3-5 steps with a railing? : A Lot 6 Click Score: 16    End of Session   Activity Tolerance: Patient limited by pain Patient left: in bed;with call bell/phone within reach;with bed alarm set Nurse Communication: Mobility status PT Visit Diagnosis: Muscle weakness (generalized) (M62.81);Difficulty in walking, not elsewhere classified (R26.2);Pain Pain - Right/Left: Right Pain - part of body: Hip;Leg     Time: 5329-9242 PT Time  Calculation (min) (ACUTE ONLY): 36 min  Charges:  $Therapeutic Exercise: 23-37 mins              Ivar Drape 03/15/2021, 5:23 PM Samul Dada, PT MS Acute Rehab Dept. Number: [facility] [id number] and [facility] [phone]

## 2021-03-15 NOTE — Progress Notes (Signed)
Orthopaedic Trauma Progress Note  SUBJECTIVE: Reports minimal pain pain about operative site at rest.  Therapies are going well.  No chest pain. No SOB. No nausea/vomiting. No other complaints.  Reports that swelling in thigh is improving. Eager to go home.  Medicine team monitoring patient's hemoglobin for 1 more day to ensure remained stable.  Is likely discharge home tomorrow.  All DME has been delivered to bedside.  TOC team working reaching out to patient's employer to obtain a claim number as this is a workerman's comp case.  OBJECTIVE:  Vitals:   03/15/21 0510 03/15/21 0900  BP: 106/69 116/82  Pulse: 82 96  Resp: 18 18  Temp: 97.9 F (36.6 C) 98.9 F (37.2 C)  SpO2: 100% 100%    General: Sitting up in bed, NAD Respiratory: No increased work of breathing.  Right Lower Extremity: Incisions are CDI.  Swelling to the thigh improving.  Less tender with palpation throughout thigh. Ankle DF/PF intact. +EHL. +FHL.  Compartments soft and compressible.  Skin warm and dry.  +DP pulse   IMAGING: Stable post op imaging.   LABS:  Results for orders placed or performed during the hospital encounter of 03/10/21 (from the past 24 hour(s))  CBC     Status: Abnormal   Collection Time: 03/15/21  2:27 AM  Result Value Ref Range   WBC 7.0 4.0 - 10.5 K/uL   RBC 2.91 (L) 4.22 - 5.81 MIL/uL   Hemoglobin 7.0 (L) 13.0 - 17.0 g/dL   HCT 87.6 (L) 81.1 - 57.2 %   MCV 72.9 (L) 80.0 - 100.0 fL   MCH 24.1 (L) 26.0 - 34.0 pg   MCHC 33.0 30.0 - 36.0 g/dL   RDW 62.0 (H) 35.5 - 97.4 %   Platelets 201 150 - 400 K/uL   nRBC 0.0 0.0 - 0.2 %    ASSESSMENT: Martin Williams is a 55 y.o. male, 4 Days Post-Op  s/p INTRAMEDULLARY NAIL RIGHT INTERTROCHANTRIC FEMUR FRACTURE  CV/Blood loss: Acute blood loss anemia, Hgb 7.0 this morning.  Received 1 unit PRBCs for hemoglobin of 6.7 on 03/12/2021  PLAN: Weightbearing: WBAT RLE Incisional and dressing care: Okay to leave incisions open to air Showering: Ok to begin  showering with assistance  Orthopedic device(s): None  Pain management:  1. Tylenol 325-650 mg q 6 hours PRN 2. Robaxin 500 mg q 6 hours PRN 3. Norco 5-325 mg q 4 hours PRN moderate pain 4. Norco 7.5-325 mg q 4 hours PRN severe pain 5. Morphine 0.5-1 mg q 2 hours PRN breakthrough pain VTE prophylaxis: Lovenox on hold due to low hemoglobin, SCDs ID:  Ancef 2gm post op completed Foley/Lines:  No foley, KVO IVFs Impediments to Fracture Healing: Vit D level 8, continue on D3 and D2 supplementation Dispo: Therapies as tolerated, recommending home health PT.  No OT follow-up recommended. Continue to monitor CBC.  We will plan to transition to Eliquis for DVT prophylaxis at discharge.  Patient okay for discharge from Ortho standpoint once cleared by medicine team and therapies. Follow - up plan: 2 weeks after d/c for repeat x-rays and wound check   Contact information:  Truitt Merle MD, Ulyses Southward PA-C. After hours and holidays please check [email or web address] for group call information for [facility]   Madelyn Tlatelpa A. Michaelyn Barter, PA-C [phone] (office) [email or web address]

## 2021-03-15 NOTE — Progress Notes (Addendum)
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 Dahlia Client provided NCM with workman's compensation CM info: Lacy Duverney CM [phone]), fax # [phone].  NCM called and spoke with  Eber Jones CM. Eber Jones requested clinicals and  HH/ DME orders. Requested information faxed to [facility] CM per NCM. TOC team will continue monitoring and assist with TOC needs. Gae Gallop RN,BSN,CM [phone]

## 2021-03-15 NOTE — Progress Notes (Addendum)
Family Medicine Teaching Service Daily Progress Note Intern Pager: [phone]  Patient name: Martin Williams Medical record number: [id number] Date of birth: 16-Dec-1966 Age: 55 y.o. Gender: male  Primary Care Provider: Patient, No Pcp Per (Inactive) Consultants: Ortho  Code Status: Full   Pt Overview and Major Events to Date:  3/27 Admitted, ORIF  3/28 Transfused 1u pRBC  Assessment and Plan: Martin Williams is a 55 y.o. male presenting with R hip fx. PMH is significant for none.   R hip fx s/p ORIF R lateral thigh edematous though improving from yesterday - Ortho following, appreciate continued care and recommendations  - pain regimen per Ortho  - PT/OT  Mild microcytic anemia  Acute blood loss anemia Hgb dropped to 7.0 - f/u H&H this afternoon - transfuse if below 7 - if hgb stable will restart apixaban later today   Vitamin D deficiency  Vit D level 8 on 3/27 - 2000 units daily, 50,000 units weekly per Ortho   FEN/GI:  Prophylaxis: SCD, lovenox    Disposition: Home potentially tomorrow pending stable hgb   Subjective:  No acute events overnight. Patient denies feeling dizzy or lightheaded. Just endorses some continued right thigh pain from his procedure   Objective: Temp:  [97.9 F (36.6 C)-99.2 F (37.3 C)] 98.9 F (37.2 C) (03/31 0900) Pulse Rate:  [82-96] 96 (03/31 0900) Resp:  [14-18] 18 (03/31 0900) BP: (106-135)/(66-82) 116/82 (03/31 0900) SpO2:  [94 %-100 %] 100 % (03/31 0900) Physical Exam: General: alert, sitting in bed, NAD Cardiovascular: RRR no murmurs Respiratory: CTAB normal WOB Abdomen: soft, non distended  Extremities: warm, dry. R thigh with edema and tenderness to palpation. Incision areas healing well without erythema or drainage   Laboratory: Recent Labs  Lab 03/12/21 2216 03/13/21 0405 03/13/21 1435 03/14/21 0515 03/14/21 1233 03/15/21 0227  WBC 12.5* 13.4*  --   --   --  7.0  HGB 8.1* 8.9*   < > 7.1* 7.6* 7.0*   HCT 24.5* 26.6*   < > 22.0* 23.1* 21.2*  PLT 155 147*  --   --   --  201   < > = values in this interval not displayed.   Recent Labs  Lab 03/10/21 2226 03/11/21 1432 03/12/21 0454 03/12/21 2216 03/14/21 0515  NA 131*   < > 129* 129* 130*  K 3.6   < > 3.7 3.5 3.8  CL 102   < > 99 99 95*  CO2 23   < > 24 26 25   BUN 6   < > <5* 6 5*  CREATININE 0.74   < > 0.67 0.80 0.65  CALCIUM 8.9   < > 8.3* 8.4* 8.8*  PROT 7.3  --   --   --   --   BILITOT 0.4  --   --   --   --   ALKPHOS 61  --   --   --   --   ALT 15  --   --   --   --   AST 34  --   --   --   --   GLUCOSE 109*   < > 147* 130* 81   < > = values in this interval not displayed.     Imaging/Diagnostic Tests: None new  , DO 03/15/2021, 12:17 PM PGY-1, [name] Family Medicine FPTS Intern pager: [phone], text pages welcome

## 2021-03-15 NOTE — Plan of Care (Signed)

## 2021-03-16 ENCOUNTER — Other Ambulatory Visit: Payer: Self-pay | Admitting: Family Medicine

## 2021-03-16 LAB — CBC
HCT: 22.1 % — ABNORMAL LOW (ref 39.0–52.0)
Hemoglobin: 7.2 g/dL — ABNORMAL LOW (ref 13.0–17.0)
MCH: 23.6 pg — ABNORMAL LOW (ref 26.0–34.0)
MCHC: 32.6 g/dL (ref 30.0–36.0)
MCV: 72.5 fL — ABNORMAL LOW (ref 80.0–100.0)
Platelets: 248 10*3/uL (ref 150–400)
RBC: 3.05 MIL/uL — ABNORMAL LOW (ref 4.22–5.81)
RDW: 17.3 % — ABNORMAL HIGH (ref 11.5–15.5)
WBC: 8.2 10*3/uL (ref 4.0–10.5)
nRBC: 0 % (ref 0.0–0.2)

## 2021-03-16 MED ORDER — APIXABAN 2.5 MG PO TABS: 2.5000 mg | ORAL_TABLET | Freq: Two times a day (BID) | ORAL | 0 refills | Status: DC

## 2021-03-16 MED ORDER — VITAMIN D3 25 MCG PO TABS: 2000.0000 [IU] | ORAL_TABLET | Freq: Every day | ORAL | 0 refills | Status: DC

## 2021-03-16 MED ORDER — FERROUS SULFATE 325 (65 FE) MG PO TABS: 325.0000 mg | ORAL_TABLET | Freq: Every day | ORAL | 0 refills | Status: DC

## 2021-03-16 MED ORDER — VITAMIN D (ERGOCALCIFEROL) 1.25 MG (50000 UNIT) PO CAPS: 50000.0000 [IU] | ORAL_CAPSULE | ORAL | 0 refills | Status: DC

## 2021-03-16 MED ORDER — HYDROCODONE-ACETAMINOPHEN 5-325 MG PO TABS: 1.0000 | ORAL_TABLET | ORAL | 0 refills | Status: DC | PRN

## 2021-03-16 MED ORDER — POLYETHYLENE GLYCOL 3350 17 G PO PACK: 17.0000 g | PACK | Freq: Every day | ORAL | 0 refills | Status: DC | PRN

## 2021-03-16 MED FILL — ELIQUIS 2.5 MG TABLET: 2.5 | 30 days supply | Qty: 60 | Fill #0

## 2021-03-16 MED FILL — VIT D2 1.25 MG (50,000 UNIT: 1.25 MG | 28 days supply | Qty: 5 | Fill #0

## 2021-03-16 MED FILL — HYDROCODON-APAP 5-325: 5-325 | 2 days supply | Qty: 15 | Fill #0

## 2021-03-16 MED FILL — FERROUS SULFATE 325 MG TAB: 325 (65 FE) | 60 days supply | Qty: 60 | Fill #0

## 2021-03-16 MED FILL — POLYETHYLENE GLYCOL 3350 PO: 17 | 30 days supply | Qty: 510 | Fill #0

## 2021-03-16 MED FILL — VITAMIN D3 25 MCG TABS: 25 | 30 days supply | Qty: 60 | Fill #0

## 2021-03-16 NOTE — Progress Notes (Signed)
Family Medicine Teaching Service Daily Progress Note Intern Pager: [phone]  Patient name: Martin Williams Medical record number: [id number] Date of birth: 12/10/1966 Age: 55 y.o. Gender: male  Primary Care Provider: Patient, No Pcp Per (Inactive) Consultants: Orthopedic surgery Code Status: Full  Pt Overview and Major Events to Date:  3/27: Admitted, ORIF 3/28: Transfused 1 unit pRBC  Assessment and Plan: JAIVYN GULLA is a 55 y.o. male presenting with R hip fx. PMH is significant for none.   R hip fx s/p ORIF About 4 cm linear incision scar noted to be healing appropriately, no edema or surrounding erythema noted.  - Ortho following, appreciate continued care and recommendations  - pain regimen per Ortho  - PT/OT  Mild microcytic anemia  Acute blood loss anemia Hgb stable at 7.2 from yesterday. S/p 1 unit pRBC -Transfusion threshold 7  -Eliquis restarted 3/31 -monitor Hgb  -oral iron supplementation on discharge  Vitamin D deficiency  Vit D level 8 on 3/27 - 2000 units daily, 50,000 units weekly per Ortho   FEN/GI: regular PPx: Lovenox, SCDs   Status is: Inpatient    Dispo:  Patient From: Home  Planned Disposition: Home with Health Care Svc  Medically stable for discharge: Yes          Subjective:  No acute overnight events reported. Patient is eager to go home, has no further questions or concerns at this time.   Objective: Temp:  [98.9 F (37.2 C)-99.1 F (37.3 C)] 99.1 F (37.3 C) (04/01 0642) Pulse Rate:  [81-96] 81 (03/31 2218) Resp:  [16-18] 18 (04/01 0642) BP: (113-123)/(70-83) 118/74 (04/01 0642) SpO2:  [98 %-100 %] 98 % (04/01 7342) Physical Exam: General: Patient sitting upright in bed, in no acute distress. Cardiovascular: RRR, no murmurs or gallops auscultated Respiratory: CTAB, no wheezing, rales or rhonchi noted  Abdomen: soft, nontender, nondistended, presence of active BS Extremities: radial and distal pulses  strong and equal bilaterally, no LE edema noted bilaterally Derm: 4 cm healing scar along right lateral hip s/p ORIF, no drainage or bleeding noted, no other rashes or lesions noted Neuro: gross sensation intact, CN 2-12 grossly intact, appropriately conversational Psych: mood appropriate   Laboratory: Recent Labs  Lab 03/13/21 0405 03/13/21 1435 03/15/21 0227 03/15/21 1507 03/16/21 0614  WBC 13.4*  --  7.0  --  8.2  HGB 8.9*   < > 7.0* 7.2* 7.2*  HCT 26.6*   < > 21.2* 22.2* 22.1*  PLT 147*  --  201  --  248   < > = values in this interval not displayed.   Recent Labs  Lab 03/10/21 2226 03/11/21 1432 03/12/21 0454 03/12/21 2216 03/14/21 0515  NA 131*   < > 129* 129* 130*  K 3.6   < > 3.7 3.5 3.8  CL 102   < > 99 99 95*  CO2 23   < > 24 26 25   BUN 6   < > <5* 6 5*  CREATININE 0.74   < > 0.67 0.80 0.65  CALCIUM 8.9   < > 8.3* 8.4* 8.8*  PROT 7.3  --   --   --   --   BILITOT 0.4  --   --   --   --   ALKPHOS 61  --   --   --   --   ALT 15  --   --   --   --   AST 34  --   --   --   --  GLUCOSE 109*   < > 147* 130* 81   < > = values in this interval not displayed.      Imaging/Diagnostic Tests: No results found.  Reece Leader, DO 03/16/2021, 7:59 AM PGY-1, [facility] Family Medicine FPTS Intern pager: [phone], text pages welcome

## 2021-03-16 NOTE — Progress Notes (Signed)
Discharge summary  Packet provided to pt with instructions. Pt verbalized understanding of instructions with no complaints . D/C to home as ordered.TOC provided the meds. Family is resposible for pt's ride.

## 2021-03-16 NOTE — Progress Notes (Signed)
Occupational Therapy Treatment Patient Details Name: Martin Williams MRN: [id number] DOB: 16-Dec-1966 Today's Date: 03/16/2021    History of present illness Pt is a 55 yo male who slipped at work on a wet floor and sustained a R displaced intertrochanteric fx who then underwent an IM nail to R hip on 3/27. PMH: unremarkable   OT comments  Pt making progress towards goals. Pt was able to demonstrate safe use of tub transfer with min guard assistance and RW for safety. Pt receptive to all education regarding bathroom safety. Discussed need for pt to have help from family when transfering for safe DC home. OT will follow acutely.     Follow Up Recommendations  Home health OT;No OT follow up;Supervision - Intermittent    Equipment Recommendations  Tub/shower bench       Precautions / Restrictions Precautions Precautions: Fall Restrictions Weight Bearing Restrictions: Yes RLE Weight Bearing: Weight bearing as tolerated       Mobility Bed Mobility Overal bed mobility: Modified Independent             General bed mobility comments: Pt was encouraged to practice bed mobility with bed in flat postion to simulate his bed at home, he was receptive however he did not want to try this session    Transfers Overall transfer level: Needs assistance Equipment used: Rolling walker (2 wheeled) Transfers: Sit to/from [employer] Transfers Sit to Stand: Supervision Stand pivot transfers: Supervision            Balance Overall balance assessment: Needs assistance         Standing balance support: Single extremity supported Standing balance-Leahy Scale: Poor Standing balance comment: reliant on external support with RW                           ADL either performed or assessed with clinical judgement   ADL                   Upper Body Dressing : Set up;Sitting               Tub/ Shower Transfer: Insurance risk surveyor Details (indicate cue  type and reason): Pt completed tub bench transfer with RW and min guard assistance for safety, pt demonstrated good ability to adhere to RLE WBAT status with RW during transfer and was receptive to all education.   General ADL Comments: Pt performed all functional trasnfers with min guard assistance using RW. Pt was educatued on the use of tub transfer bench, how to manage the curtain of tub shower, towel on bench seat, and to remain seated for entire shower. Pt was receptive and demonstrated good understanding.                Cognition Arousal/Alertness: Awake/alert Behavior During Therapy: WFL for tasks assessed/performed                                                       Pertinent Vitals/ Pain       Pain Assessment: 0-10 Pain Score: 3  Pain Location: R LE Pain Descriptors / Indicators: Discomfort;Guarding Pain Intervention(s): Limited activity within patient's tolerance;Monitored during session         Frequency  Min 2X/week        Progress  Toward Goals  OT Goals(current goals can now be found in the care plan section)  Progress towards OT goals: Progressing toward goals     Plan Discharge plan remains appropriate       AM-PAC OT "6 Clicks" Daily Activity     Outcome Measure   Help from another person eating meals?: A Little Help from another person taking care of personal grooming?: A Little Help from another person toileting, which includes using toliet, bedpan, or urinal?: A Little Help from another person bathing (including washing, rinsing, drying)?: A Little Help from another person to put on and taking off regular upper body clothing?: A Little Help from another person to put on and taking off regular lower body clothing?: A Little 6 Click Score: 18    End of Session Equipment Utilized During Treatment: Gait belt;Rolling walker (tub shower bench)  OT Visit Diagnosis: Unsteadiness on feet (R26.81);Other abnormalities of gait and  mobility (R26.89);Muscle weakness (generalized) (M62.81);Pain Pain - Right/Left: Right Pain - part of body: Leg   Activity Tolerance Patient tolerated treatment well   Patient Left in chair;with chair alarm set;with call bell/phone within reach (Pharmacy rep in room)   Nurse Communication  (Communicated pt position and equipment update)        Time: 5102-5852 OT Time Calculation (min): 45 min  Charges: OT General Charges $OT Visit: 1 Visit OT Treatments $Self Care/Home Management : 38-52 mins     Yaniah Thiemann A Liberty Seto 03/16/2021, 1:37 PM

## 2021-03-16 NOTE — Progress Notes (Signed)
Pt has been transported by volunteer staff via w/c, Pt has no complaints.

## 2021-03-16 NOTE — TOC Transition Note (Signed)
Transition of Care [facility]) - CM/SW Discharge Note   Patient Details  Name: Martin Williams MRN: [id number] Date of Birth: [birth year]  Transition of [facility]) CM/SW Contact:  Epifanio Lesches, RN Phone Number: 03/16/2021, 4:30 PM   Clinical Narrative:    Late entry Patient will DC to: home Anticipated DC date: 03/16/2021 Family notified: sister Transport by: car               s/p IMN to  R femur fx, 03/11/2021  Per MD patient ready for DC today . RN, patient, and patient's sister, notified of DC. Clinicals faxed to [facility] NCM/ [employer]) @ [phone], cell # [phone]. TOC pharmacy delivered Rx meds to pt's bedside prior to d/c. DME delivered to pt room prior to d/c. Per workman's comp rep.they will f/u with HHPT needs.  Pt with post hospital f/u noted on AVS.  Sister to provide transportation to home.    RNCM will sign off for now as intervention is no longer needed. Please consult [place] again if new needs arise.    Final next level of care: Home w Home Health Services Barriers to Discharge: No Barriers Identified   Patient Goals and CMS Choice     Choice offered to / list presented to : Patient  Discharge Placement                       Discharge Plan and Services   Discharge Planning Services: CM Consult                                 Social Determinants of Health (SDOH) Interventions     Readmission Risk Interventions No flowsheet data found.

## 2021-03-20 ENCOUNTER — Other Ambulatory Visit (HOSPITAL_COMMUNITY): Payer: Self-pay

## 2021-07-02 ENCOUNTER — Inpatient Hospital Stay (HOSPITAL_COMMUNITY)
Admission: EM | Admit: 2021-07-02 | Discharge: 2021-07-07 | DRG: 643 | Disposition: A | Discharge status: Home / Self Care | Payer: Self-pay | Attending: Family Medicine | Admitting: Family Medicine

## 2021-07-02 ENCOUNTER — Other Ambulatory Visit: Payer: Self-pay

## 2021-07-02 ENCOUNTER — Emergency Department (HOSPITAL_COMMUNITY): Payer: Self-pay

## 2021-07-02 ENCOUNTER — Emergency Department (HOSPITAL_BASED_OUTPATIENT_CLINIC_OR_DEPARTMENT_OTHER): Payer: Self-pay

## 2021-07-02 DIAGNOSIS — E222 Syndrome of inappropriate secretion of antidiuretic hormone: Principal | ICD-10-CM | POA: Diagnosis present

## 2021-07-02 DIAGNOSIS — M79605 Pain in left leg: Secondary | ICD-10-CM

## 2021-07-02 DIAGNOSIS — L89151 Pressure ulcer of sacral region, stage 1: Secondary | ICD-10-CM | POA: Diagnosis present

## 2021-07-02 DIAGNOSIS — R251 Tremor, unspecified: Secondary | ICD-10-CM

## 2021-07-02 DIAGNOSIS — E232 Diabetes insipidus: Secondary | ICD-10-CM

## 2021-07-02 DIAGNOSIS — I159 Secondary hypertension, unspecified: Secondary | ICD-10-CM

## 2021-07-02 DIAGNOSIS — Z79899 Other long term (current) drug therapy: Secondary | ICD-10-CM

## 2021-07-02 DIAGNOSIS — Z20822 Contact with and (suspected) exposure to covid-19: Secondary | ICD-10-CM | POA: Diagnosis present

## 2021-07-02 DIAGNOSIS — E871 Hypo-osmolality and hyponatremia: Principal | ICD-10-CM

## 2021-07-02 DIAGNOSIS — E875 Hyperkalemia: Secondary | ICD-10-CM | POA: Diagnosis not present

## 2021-07-02 DIAGNOSIS — M79604 Pain in right leg: Secondary | ICD-10-CM

## 2021-07-02 DIAGNOSIS — N390 Urinary tract infection, site not specified: Secondary | ICD-10-CM | POA: Diagnosis present

## 2021-07-02 DIAGNOSIS — R631 Polydipsia: Secondary | ICD-10-CM | POA: Diagnosis present

## 2021-07-02 DIAGNOSIS — I10 Essential (primary) hypertension: Secondary | ICD-10-CM | POA: Diagnosis present

## 2021-07-02 DIAGNOSIS — E43 Unspecified severe protein-calorie malnutrition: Secondary | ICD-10-CM | POA: Diagnosis present

## 2021-07-02 DIAGNOSIS — F10931 Alcohol use, unspecified with withdrawal delirium: Secondary | ICD-10-CM | POA: Diagnosis present

## 2021-07-02 DIAGNOSIS — L8990 Pressure ulcer of unspecified site, unspecified stage: Secondary | ICD-10-CM | POA: Insufficient documentation

## 2021-07-02 DIAGNOSIS — R52 Pain, unspecified: Secondary | ICD-10-CM

## 2021-07-02 DIAGNOSIS — M25551 Pain in right hip: Secondary | ICD-10-CM

## 2021-07-02 DIAGNOSIS — R600 Localized edema: Secondary | ICD-10-CM

## 2021-07-02 DIAGNOSIS — F54 Psychological and behavioral factors associated with disorders or diseases classified elsewhere: Secondary | ICD-10-CM | POA: Diagnosis present

## 2021-07-02 DIAGNOSIS — F10231 Alcohol dependence with withdrawal delirium: Secondary | ICD-10-CM | POA: Diagnosis present

## 2021-07-02 DIAGNOSIS — F17210 Nicotine dependence, cigarettes, uncomplicated: Secondary | ICD-10-CM | POA: Diagnosis present

## 2021-07-02 DIAGNOSIS — R609 Edema, unspecified: Secondary | ICD-10-CM

## 2021-07-02 DIAGNOSIS — Z7901 Long term (current) use of anticoagulants: Secondary | ICD-10-CM

## 2021-07-02 DIAGNOSIS — Z6822 Body mass index (BMI) 22.0-22.9, adult: Secondary | ICD-10-CM

## 2021-07-02 LAB — CBC WITH DIFFERENTIAL/PLATELET
Abs Immature Granulocytes: 0.03 10*3/uL (ref 0.00–0.07)
Abs Immature Granulocytes: 0 10*3/uL (ref 0.00–0.07)
Basophils Absolute: 0.1 10*3/uL (ref 0.0–0.1)
Basophils Absolute: 0.2 10*3/uL — ABNORMAL HIGH (ref 0.0–0.1)
Basophils Relative: 2 %
Basophils Relative: 4 %
Eosinophils Absolute: 0.1 10*3/uL (ref 0.0–0.5)
Eosinophils Absolute: 0.1 10*3/uL (ref 0.0–0.5)
Eosinophils Relative: 1 %
Eosinophils Relative: 3 %
HCT: 32.9 % — ABNORMAL LOW (ref 39.0–52.0)
HCT: 30.3 % — ABNORMAL LOW (ref 39.0–52.0)
Hemoglobin: 11.3 g/dL — ABNORMAL LOW (ref 13.0–17.0)
Hemoglobin: 10.6 g/dL — ABNORMAL LOW (ref 13.0–17.0)
Immature Granulocytes: 1 %
Lymphocytes Relative: 21 %
Lymphocytes Relative: 23 %
Lymphs Abs: 1.1 10*3/uL (ref 0.7–4.0)
Lymphs Abs: 1.1 10*3/uL (ref 0.7–4.0)
MCH: 23 pg — ABNORMAL LOW (ref 26.0–34.0)
MCH: 22.9 pg — ABNORMAL LOW (ref 26.0–34.0)
MCHC: 34.3 g/dL (ref 30.0–36.0)
MCHC: 35 g/dL (ref 30.0–36.0)
MCV: 66.9 fL — ABNORMAL LOW (ref 80.0–100.0)
MCV: 65.6 fL — ABNORMAL LOW (ref 80.0–100.0)
Monocytes Absolute: 0.9 10*3/uL (ref 0.1–1.0)
Monocytes Absolute: 0.6 10*3/uL (ref 0.1–1.0)
Monocytes Relative: 16 %
Monocytes Relative: 12 %
Neutro Abs: 3.2 10*3/uL (ref 1.7–7.7)
Neutro Abs: 2.7 10*3/uL (ref 1.7–7.7)
Neutrophils Relative %: 59 %
Neutrophils Relative %: 58 %
Platelets: 280 10*3/uL (ref 150–400)
Platelets: 266 10*3/uL (ref 150–400)
RBC: 4.92 MIL/uL (ref 4.22–5.81)
RBC: 4.62 MIL/uL (ref 4.22–5.81)
RDW: 15.6 % — ABNORMAL HIGH (ref 11.5–15.5)
RDW: 15.2 % (ref 11.5–15.5)
WBC: 5.3 10*3/uL (ref 4.0–10.5)
WBC: 4.6 10*3/uL (ref 4.0–10.5)
nRBC: 0 % (ref 0.0–0.2)
nRBC: 0 % (ref 0.0–0.2)
nRBC: 0 /100 WBC

## 2021-07-02 LAB — HEPATIC FUNCTION PANEL
ALT: 29 U/L (ref 0–44)
AST: 57 U/L — ABNORMAL HIGH (ref 15–41)
Albumin: 3.6 g/dL (ref 3.5–5.0)
Alkaline Phosphatase: 80 U/L (ref 38–126)
Bilirubin, Direct: 0.2 mg/dL (ref 0.0–0.2)
Indirect Bilirubin: 0.6 mg/dL (ref 0.3–0.9)
Total Bilirubin: 0.8 mg/dL (ref 0.3–1.2)
Total Protein: 7.7 g/dL (ref 6.5–8.1)

## 2021-07-02 LAB — I-STAT CHEM 8, ED
BUN: 3 mg/dL — ABNORMAL LOW (ref 6–20)
Calcium, Ion: 1.1 mmol/L — ABNORMAL LOW (ref 1.15–1.40)
Chloride: 75 mmol/L — ABNORMAL LOW (ref 98–111)
Creatinine, Ser: 0.4 mg/dL — ABNORMAL LOW (ref 0.61–1.24)
Glucose, Bld: 94 mg/dL (ref 70–99)
HCT: 39 % (ref 39.0–52.0)
Hemoglobin: 13.3 g/dL (ref 13.0–17.0)
Potassium: 4.1 mmol/L (ref 3.5–5.1)
Sodium: 109 mmol/L — CL (ref 135–145)
TCO2: 23 mmol/L (ref 22–32)

## 2021-07-02 LAB — URINALYSIS, ROUTINE W REFLEX MICROSCOPIC
Bilirubin Urine: NEGATIVE
Glucose, UA: NEGATIVE mg/dL
Hgb urine dipstick: NEGATIVE
Ketones, ur: 5 mg/dL — AB
Nitrite: POSITIVE — AB
Protein, ur: NEGATIVE mg/dL
Specific Gravity, Urine: 1.013 (ref 1.005–1.030)
pH: 8 (ref 5.0–8.0)

## 2021-07-02 LAB — BASIC METABOLIC PANEL
Anion gap: 12 (ref 5–15)
BUN: <5 mg/dL — ABNORMAL LOW (ref 6–20)
CO2: 24 mmol/L (ref 22–32)
Calcium: 9.1 mg/dL (ref 8.9–10.3)
Chloride: 75 mmol/L — ABNORMAL LOW (ref 98–111)
Creatinine, Ser: 0.47 mg/dL — ABNORMAL LOW (ref 0.61–1.24)
GFR, Estimated: >60 mL/min (ref >60)
Glucose, Bld: 89 mg/dL (ref 70–99)
Potassium: 3.7 mmol/L (ref 3.5–5.1)
Sodium: 111 mmol/L — CL (ref 135–145)

## 2021-07-02 LAB — BRAIN NATRIURETIC PEPTIDE: B Natriuretic Peptide: 25.8 pg/mL (ref 0.0–100.0)

## 2021-07-02 LAB — RESP PANEL BY RT-PCR (FLU A&B, COVID) ARPGX2
Influenza A by PCR: NEGATIVE
Influenza B by PCR: NEGATIVE
SARS Coronavirus 2 by RT PCR: NEGATIVE

## 2021-07-02 MED ORDER — SODIUM CHLORIDE 3 % IV BOLUS: 250.0000 mL | Freq: Once | INTRAVENOUS | Status: DC

## 2021-07-02 MED ORDER — APIXABAN 2.5 MG PO TABS: 2.5000 mg | ORAL_TABLET | Freq: Two times a day (BID) | ORAL | Status: DC

## 2021-07-02 MED ORDER — SODIUM CHLORIDE 0.9 % IV SOLN
1.0000 g | INTRAVENOUS | Status: DC
  Administered 2021-07-03 – 2021-07-04 (×3): 1 g via INTRAVENOUS
  Filled 2021-07-02 (×2): qty 10
  Filled 2021-07-02: qty 1
  Filled 2021-07-02: qty 10

## 2021-07-02 MED ORDER — POLYETHYLENE GLYCOL 3350 17 G PO PACK: 17.0000 g | PACK | Freq: Every day | ORAL | Status: DC | PRN

## 2021-07-02 MED ORDER — DOCUSATE SODIUM 100 MG PO CAPS: 100.0000 mg | ORAL_CAPSULE | Freq: Two times a day (BID) | ORAL | Status: DC | PRN

## 2021-07-02 MED ORDER — HEPARIN SODIUM (PORCINE) 5000 UNIT/ML IJ SOLN
5000.0000 [IU] | Freq: Three times a day (TID) | INTRAMUSCULAR | Status: DC
  Administered 2021-07-03 – 2021-07-06 (×12): 5000 [IU] via SUBCUTANEOUS
  Filled 2021-07-02 (×12): qty 1

## 2021-07-02 MED ORDER — ENALAPRILAT 1.25 MG/ML IV SOLN
0.6250 mg | Freq: Four times a day (QID) | INTRAVENOUS | Status: DC | PRN
  Filled 2021-07-02: qty 0.5

## 2021-07-02 MED ORDER — FAMOTIDINE 20 MG PO TABS
20.0000 mg | ORAL_TABLET | Freq: Two times a day (BID) | ORAL | Status: DC
  Administered 2021-07-03 (×2): 20 mg via ORAL
  Filled 2021-07-02 (×2): qty 1

## 2021-07-02 MED ORDER — SODIUM CHLORIDE 3 % IV SOLN
INTRAVENOUS | Status: DC
  Filled 2021-07-02 (×2): qty 500

## 2021-07-02 NOTE — ED Provider Notes (Signed)
Emergency Medicine Provider Triage Evaluation Note  Martin Williams , a 55 y.o. male  was evaluated in triage.  Pt complains of RLE pain and bilateral lower extremity edema. Patient states symptoms have been present for awhile now. Denies chest pain and shortness of breath. He had RLE surgery in march 2022. No recent injury. He also notes he has been diaphoretic over the past few days. No documented fever.  Review of Systems  Positive: Edema Negative:   Physical Exam  BP (!) 184/83 (BP Location: Right Arm)   Pulse 86   Temp 98.2 F (36.8 C) (Oral)   Resp 19   Ht 6\' 2"  (1.88 m)   Wt 80 kg   SpO2 99%   BMI 22.64 kg/m  Gen:   Awake, no distress   Resp:  Normal effort  MSK:   Moves extremities without difficulty  Other:    Medical Decision Making  Medically screening exam initiated at 3:17 PM.  Appropriate orders placed.  was informed that the remainder of the evaluation will be completed by another provider, this initial triage assessment does not replace that evaluation, and the importance of remaining in the ED until their evaluation is complete.  Jeani Sow to rule out DVT Labs, BNP to rule out CHF  COVID test   [place], PA-C 07/02/21 1518    07/04/21, MD 07/04/21 [phone]

## 2021-07-02 NOTE — Progress Notes (Signed)
Bilateral lower extremity venous duplex completed. Refer to "CV Proc" under chart review to view preliminary results.  07/02/2021 5:02 PM Eula Fried., MHA, RVT, RDCS, RDMS

## 2021-07-02 NOTE — ED Notes (Signed)
Pt called for vitals check. No response.

## 2021-07-02 NOTE — H&P (Addendum)
NAME:  Martin Williams MRN:  [id number] DOB:  Aug 29, 1966 LOS: 0 ADMISSION DATE:  07/02/2021 DATE OF SERVICE:  07/02/2021  CHIEF COMPLAINT:  fall   HISTORY & PHYSICAL  History of Present Illness  This 55 y.o. African American male smoker presented to the [facility] Emergency Department via EMS with complaints of R LE swelling and pain after a mechanical fall (wet floor); no loss of consciousness.  He also reported a headache.  CT head was negative for acute intracranial process.  In the ER, the patient's evaluation related to the fall thus far has not resulted in significant diagnoses or complications, but he was incidentally found to be hyponatremic (109).  In fact, his labs are generally compatible with hemodilution.  He was deemed to be tremulous, and so the ER asked PCCM to admit the patient for treatment of symptomatic, acute, severe hyponatremia.  The patient lives with his sister.  He states that he has had nothing to eat in "a day and a half" but drinks beer and water to keep himself full.  He reports anorexia and diarrhea.  REVIEW OF SYSTEMS Constitutional: No weight loss. No night sweats. No fever. No chills. No fatigue. HEENT: Headaches.  No dysphagia, sore throat, otalgia, nasal congestion, PND CV:  Bilateral lower extremity edema (R>L).  No chest pain, orthopnea, PND, swelling in lower extremities, palpitations GI:  Diarrhea.  No abdominal pain, nausea, vomiting. Resp: No DOE, rest dyspnea, cough, mucus, hemoptysis, wheezing  GU: Urinary frequency.  No dysuria, change in color of urine.  No flank pain. MS:  No joint pain or swelling. No myalgias,  No decreased range of motion.  Psych:  No change in mood or affect. No memory loss. Skin: no rash or lesions.   Past Medical/Surgical/Social/Family History   Past Medical History:  Diagnosis Date   Tobacco abuse     Past Surgical History:  Procedure Laterality Date   INTRAMEDULLARY (IM) NAIL INTERTROCHANTERIC  Right 03/11/2021   Procedure: INTRAMEDULLARY (IM) NAIL INTERTROCHANTRIC;  Surgeon: Roby Lofts, MD;  Location: [facility] OR;  Service: Orthopedics;  Laterality: Right;   LAPAROSCOPIC GASTROTOMY W/ REPAIR OF ULCER      Social History   Tobacco Use   Smoking status: Every Day    Packs/day: 0.50    Types: Cigarettes   Smokeless tobacco: Never  Substance Use Topics   Alcohol use: Yes    Comment: occ    No family history on file.   Procedures:     Significant Diagnostic Tests:  7/18: [place] B LE done; report pending 7/18: head CT done; report pending   Micro Data:   Results for orders placed or performed during the hospital encounter of 07/02/21  Resp Panel by RT-PCR (Flu A&B, Covid) Nasopharyngeal Swab     Status: None   Collection Time: 07/02/21  8:09 PM   Specimen: Nasopharyngeal Swab; Nasopharyngeal(NP) swabs in vial transport medium  Result Value Ref Range Status   SARS Coronavirus 2 by RT PCR NEGATIVE NEGATIVE Final    Comment: (NOTE) SARS-CoV-2 target nucleic acids are NOT DETECTED.  The SARS-CoV-2 RNA is generally detectable in upper respiratory specimens during the acute phase of infection. The lowest concentration of SARS-CoV-2 viral copies this assay can detect is 138 copies/mL. A negative result does not preclude SARS-Cov-2 infection and should not be used as the sole basis for treatment or other patient management decisions. A negative result may occur with  improper specimen collection/handling, submission of  specimen other than nasopharyngeal swab, presence of viral mutation(s) within the areas targeted by this assay, and inadequate number of viral copies(<138 copies/mL). A negative result must be combined with clinical observations, patient history, and epidemiological information. The expected result is Negative.  Fact Sheet for Patients:  BloggerCourse.comhttps://www.fda.gov/media/152166/download  Fact Sheet for Healthcare Providers:   SeriousBroker.ithttps://www.fda.gov/media/152162/download  This test is no t yet approved or cleared by the [place] FDA and  has been authorized for detection and/or diagnosis of SARS-CoV-2 by FDA under an Emergency Use Authorization (EUA). This EUA will remain  in effect (meaning this test can be used) for the duration of the COVID-19 declaration under Section 564(b)(1) of the Act, 21 U.S.C.section 360bbb-3(b)(1), unless the authorization is terminated  or revoked sooner.       Influenza A by PCR NEGATIVE NEGATIVE Final   Influenza B by PCR NEGATIVE NEGATIVE Final    Comment: (NOTE) The Xpert Xpress SARS-CoV-2/FLU/RSV plus assay is intended as an aid in the diagnosis of influenza from Nasopharyngeal swab specimens and should not be used as a sole basis for treatment. Nasal washings and aspirates are unacceptable for Xpert Xpress SARS-CoV-2/FLU/RSV testing.  Fact Sheet for Patients: BloggerCourse.comhttps://www.fda.gov/media/152166/download  Fact Sheet for Healthcare Providers: SeriousBroker.ithttps://www.fda.gov/media/152162/download  This test is not yet approved or cleared by the [place] FDA and has been authorized for detection and/or diagnosis of SARS-CoV-2 by FDA under an Emergency Use Authorization (EUA). This EUA will remain in effect (meaning this test can be used) for the duration of the COVID-19 declaration under Section 564(b)(1) of the Act, 21 U.S.C. section 360bbb-3(b)(1), unless the authorization is terminated or revoked.  Performed at [facility], [address] [place], [place] [address]       Antimicrobials:  Rocephin (7/18>>)    Interim history/subjective:     Objective   BP (!) 160/115 (BP Location: Right Arm)   Pulse 81   Temp 98.3 F (36.8 C) (Oral)   Resp 18   Ht 6\' 2"  (1.88 m)   Wt 80 kg   SpO2 98%   BMI 22.64 kg/m     Filed Weights   07/02/21 1512  Weight: 80 kg   No intake or output data in the 24 hours ending 07/02/21 2255       Examination: GENERAL:  alert, oriented to time, person and place, pleasant, cachectic. No acute distress. HEAD: normocephalic, atraumatic EYE: Conjunctival injection.  PERRLA, EOM intact, no scleral icterus, no pallor. NOSE: nares are patent. No polyps. No exudate.  THROAT/ORAL CAVITY: Normal dentition. No oral thrush. No exudate. Mucous membranes are moist.  NECK: supple, no thyromegaly, no JVD, no lymphadenopathy. Trachea midline. CHEST/LUNG: symmetric in development and expansion. Good air entry. No crackles. No wheezes. HEART: Regular S1 and S2 without murmur, rub or gallop. ABDOMEN: soft, nontender, nondistended. Normoactive bowel sounds. No rebound. No guarding. No hepatosplenomegaly. EXTREMITIES: Edema: 2+ and pitting. No cyanosis. No clubbing. 2+ DP pulses LYMPHATIC: no cervical/axillary/inguinal lymph nodes appreciated MUSCULOSKELETAL: No point tenderness. No bulk atrophy. Joints: normal.  SKIN:  Dry skin.  No rash or lesion. NEUROLOGIC: Tremulous. Cranial nerves II-XII are grossly symmetric and physiologic. Babinski absent. No sensory deficit. Motor: 5/5 @ RUE, 5/5 @ LUE, 5/5 @ RLL,  5/5 @ LLL.  DTR: 2+ @ R biceps, 2+ @ L biceps, 2+ @ R patellar,  2+ @ L patellar. No cerebellar signs. Gait was not assessed.   Resolved Hospital Problem list      Assessment & Plan:  ASSESSMENT/PLAN:  ASSESSMENT (included in the Hospital Problem List)  Principal Problem:   Hyponatremia Active Problems:   Primary polydipsia (HCC)   UTI (urinary tract infection)   Delirium tremens (HCC)   Hypertension   By systems: PULMONARY: No acute issues Supplemental oxygen to maintain SpO2 93+%.   CARDIOVASCULAR Hypertension Vasotec 0.625 mg IV q 6 hr PRN for SBP > 140   RENAL Severe hyponatremia, clinically suspicious for primary polydipsia with euvolemic hyponatremia and dilute urine (or beer dinker's potomania) Urinary tract infection Patient is currently getting 3% saline in ER.   Recheck Na.  May only need fluid restriction going forward. Check lipase, lipid profile Serum and urine osmolality pending Urine Na pending Check EtOH level Place Foley catheter Monitor urine output Check UDS   GASTROINTESTINAL GI PROPHYLAXIS: famotidine   HEMATOLOGIC: No acute isssues Patient has Eliquis on his home medications (without obvious indication), perhaps for DVT prophylaxis post-orthopedic surgery (recent IM nail for hip fracture). DVT PROPHYLAXIS: heparin   INFECTIOUS Urinary tract infection Continue Rocephin   ENDOCRINE: No acute issues   NEUROLOGIC Tremulousness, possibly delirium tremens Monitor for signs of EtOH withdrawal Check NH3   PLAN/RECOMMENDATIONS  Admit to ICU under my service (Attending: Marcelle Smiling, MD) with the diagnoses highlighted above in the active Hospital Problem List (ASSESSMENT). See plan above.    My assessment, plan of care, findings, medications, side effects, etc. were discussed with: Dr. Jodi Mourning (Emergency Medicine).   Best practice:  Diet: fluid restriction (800 mL PO liquid) Pain/Anxiety/Delirium protocol (if indicated): CIWA VAP protocol (if indicated): N/A DVT prophylaxis: heparin GI prophylaxis: famotidine Glucose control: N/A Mobility/Activity: bedrest   Code Status: Full Code Family Communication:   no family at the bedside Disposition: admit to ICU   Labs   CBC: Recent Labs  Lab 07/02/21 1551 07/02/21 2006 07/02/21 2013  WBC 5.3 4.6  --   NEUTROABS 3.2 2.7  --   HGB 11.3* 10.6* 13.3  HCT 32.9* 30.3* 39.0  MCV 66.9* 65.6*  --   PLT 280 266  --     Basic Metabolic Panel: Recent Labs  Lab 07/02/21 1551 07/02/21 2013  NA 111* 109*  K 3.7 4.1  CL 75* 75*  CO2 24  --   GLUCOSE 89 94  BUN <5* 3*  CREATININE 0.47* 0.40*  CALCIUM 9.1  --    GFR: Estimated Creatinine Clearance: 116.7 mL/min (A) (by C-G formula based on SCr of 0.4 mg/dL (L)). Recent Labs  Lab 07/02/21 1551 07/02/21 2006  WBC  5.3 4.6    Liver Function Tests: Recent Labs  Lab 07/02/21 1933  AST 57*  ALT 29  ALKPHOS 80  BILITOT 0.8  PROT 7.7  ALBUMIN 3.6   No results for input(s): LIPASE, AMYLASE in the last 168 hours. No results for input(s): AMMONIA in the last 168 hours.  ABG    Component Value Date/Time   TCO2 23 07/02/2021 2013     Coagulation Profile: No results for input(s): INR, PROTIME in the last 168 hours.  Cardiac Enzymes: No results for input(s): CKTOTAL, CKMB, CKMBINDEX, TROPONINI in the last 168 hours.  HbA1C: No results found for: HGBA1C  CBG: No results for input(s): GLUCAP in the last 168 hours.   Past Medical History   Past Medical History:  Diagnosis Date   Tobacco abuse       Surgical History    Past Surgical History:  Procedure Laterality Date   INTRAMEDULLARY (IM) NAIL INTERTROCHANTERIC Right 03/11/2021   Procedure:  INTRAMEDULLARY (IM) NAIL INTERTROCHANTRIC;  Surgeon: Roby Lofts, MD;  Location: [facility] OR;  Service: Orthopedics;  Laterality: Right;   LAPAROSCOPIC GASTROTOMY W/ REPAIR OF ULCER        Social History   Social History   Socioeconomic History   Marital status: Single    Spouse name: Not on file   Number of children: Not on file   Years of education: Not on file   Highest education level: Not on file  Occupational History   Not on file  Tobacco Use   Smoking status: Every Day    Packs/day: 0.50    Types: Cigarettes   Smokeless tobacco: Never  Vaping Use   Vaping Use: Never used  Substance and Sexual Activity   Alcohol use: Yes    Comment: occ   Drug use: Not Currently   Sexual activity: Not on file  Other Topics Concern   Not on file  Social History Narrative   Not on file   Social Determinants of Health   Financial Resource Strain: Not on file  Food Insecurity: Not on file  Transportation Needs: Not on file  Physical Activity: Not on file  Stress: Not on file  Social Connections: Not on file      Family History    No family history on file. family history is not on file.    Allergies No Known Allergies    Current Medications  Current Facility-Administered Medications:    cefTRIAXone (ROCEPHIN) 1 g in sodium chloride 0.9 % 100 mL IVPB, 1 g, Intravenous, Q24H, Chandrasekar, Additya, MD   sodium chloride (hypertonic) 3 % solution, , Intravenous, Continuous, Blane Ohara, MD, Last Rate: 50 mL/hr at 07/02/21 2111, New Bag at 07/02/21 2111  Current Outpatient Medications:    acetaminophen (TYLENOL) 500 MG tablet, Take 1,000 mg by mouth every 6 (six) hours as needed for mild pain., Disp: , Rfl:    apixaban (ELIQUIS) 2.5 MG TABS tablet, Take 1 tablet (2.5 mg total) by mouth 2 (two) times daily., Disp: 60 tablet, Rfl: 0   apixaban (ELIQUIS) 2.5 MG TABS tablet, TAKE 1 TABLET (2.5 MG TOTAL) BY MOUTH TWO TIMES DAILY., Disp: 60 tablet, Rfl: 0   cholecalciferol (VITAMIN D) 25 MCG tablet, Take 2 tablets (2,000 Units total) by mouth daily., Disp: 60 tablet, Rfl: 0   ferrous sulfate 325 (65 FE) MG tablet, Take 1 tablet (325 mg total) by mouth daily., Disp: 60 tablet, Rfl: 0   ferrous sulfate 325 (65 FE) MG tablet, TAKE 1 TABLET (325 MG TOTAL) BY MOUTH DAILY., Disp: 60 tablet, Rfl: 0   HYDROcodone-acetaminophen (NORCO/VICODIN) 5-325 MG tablet, Take 1-2 tablets by mouth every 4 (four) hours as needed for moderate pain (pain score 4-6)., Disp: 15 tablet, Rfl: 0   HYDROcodone-acetaminophen (NORCO/VICODIN) 5-325 MG tablet, TAKE 1-2 TABLETS BY MOUTH EVERY FOUR HOURS AS NEEDED FOR MODERATE PAIN (PAIN SCORE 4-6)., Disp: 15 tablet, Rfl: 0   polyethylene glycol (MIRALAX / GLYCOLAX) 17 g packet, Take 17 g by mouth daily as needed for mild constipation., Disp: 14 each, Rfl: 0   polyethylene glycol powder (GLYCOLAX/MIRALAX) 17 GM/SCOOP powder, DISSOLVE 1 CAPFUL (17 G) IN WATER AND DRINK DAILY AS NEEDED FOR MILD CONSTIPATION., Disp: 510 g, Rfl: 0   Vitamin D, Ergocalciferol, (DRISDOL) 1.25 MG (50000 UNIT) CAPS capsule, Take 1  capsule (50,000 Units total) by mouth every Tuesday., Disp: 5 capsule, Rfl: 0   Vitamin D, Ergocalciferol, (DRISDOL) 1.25 MG (50000 UNIT) CAPS capsule, TAKE 1 CAPSULE (50,000  UNITS TOTAL) BY MOUTH EVERY TUESDAY., Disp: 5 capsule, Rfl: 0   Vitamin D3 (VITAMIN D) 25 MCG tablet, TAKE 2 TABLETS (2,000 UNITS TOTAL) BY MOUTH DAILY., Disp: 60 tablet, Rfl: 0   Home Medications  Prior to Admission medications   Medication Sig Start Date End Date Taking? Authorizing Provider  acetaminophen (TYLENOL) 500 MG tablet Take 1,000 mg by mouth every 6 (six) hours as needed for mild pain.    [provider]  apixaban (ELIQUIS) 2.5 MG TABS tablet Take 1 tablet (2.5 mg total) by mouth 2 (two) times daily. 03/16/21   Simmons-Robinson, Makiera, MD  apixaban (ELIQUIS) 2.5 MG TABS tablet TAKE 1 TABLET (2.5 MG TOTAL) BY MOUTH TWO TIMES DAILY. 03/16/21 03/16/22  Simmons-Robinson, Tawanna Cooler, MD  cholecalciferol (VITAMIN D) 25 MCG tablet Take 2 tablets (2,000 Units total) by mouth daily. 03/17/21   Simmons-Robinson, Makiera, MD  ferrous sulfate 325 (65 FE) MG tablet Take 1 tablet (325 mg total) by mouth daily. 03/16/21 05/15/21  Simmons-Robinson, Makiera, MD  ferrous sulfate 325 (65 FE) MG tablet TAKE 1 TABLET (325 MG TOTAL) BY MOUTH DAILY. 03/16/21 03/16/22  Simmons-Robinson, Tawanna Cooler, MD  HYDROcodone-acetaminophen (NORCO/VICODIN) 5-325 MG tablet Take 1-2 tablets by mouth every 4 (four) hours as needed for moderate pain (pain score 4-6). 03/16/21   Simmons-Robinson, Makiera, MD  HYDROcodone-acetaminophen (NORCO/VICODIN) 5-325 MG tablet TAKE 1-2 TABLETS BY MOUTH EVERY FOUR HOURS AS NEEDED FOR MODERATE PAIN (PAIN SCORE 4-6). 03/16/21 09/12/21  Simmons-Robinson, Makiera, MD  polyethylene glycol (MIRALAX / GLYCOLAX) 17 g packet Take 17 g by mouth daily as needed for mild constipation. 03/16/21   Simmons-Robinson, Makiera, MD  polyethylene glycol powder (GLYCOLAX/MIRALAX) 17 GM/SCOOP powder DISSOLVE 1 CAPFUL (17 G) IN WATER AND DRINK DAILY AS  NEEDED FOR MILD CONSTIPATION. 03/16/21 03/16/22  Simmons-Robinson, Tawanna Cooler, MD  Vitamin D, Ergocalciferol, (DRISDOL) 1.25 MG (50000 UNIT) CAPS capsule Take 1 capsule (50,000 Units total) by mouth every Tuesday. 03/20/21   Simmons-Robinson, Makiera, MD  Vitamin D, Ergocalciferol, (DRISDOL) 1.25 MG (50000 UNIT) CAPS capsule TAKE 1 CAPSULE (50,000 UNITS TOTAL) BY MOUTH EVERY TUESDAY. 03/16/21 03/16/22  Simmons-Robinson, Makiera, MD  Vitamin D3 (VITAMIN D) 25 MCG tablet TAKE 2 TABLETS (2,000 UNITS TOTAL) BY MOUTH DAILY. 03/16/21 03/16/22  Simmons-Robinson, Tawanna Cooler, MD     Critical care time: 45 minutes.  The treatment and management of the patient's condition was required based on the threat of imminent deterioration. This time reflects time spent by the physician evaluating, providing care and managing the critically ill patient's care. The time was spent at the immediate bedside (or on the same floor/unit and dedicated to this patient's care). Time involved in separately billable procedures is NOT included int he critical care time indicated above. Family meeting and update time may be included above if and only if the patient is unable/incompetent to participate in clinical interview and/or decision making, and the discussion was necessary to determining treatment decisions.   Marcelle Smiling, MD Board Certified by the ABIM, Pulmonary Diseases & Critical Care Medicine

## 2021-07-02 NOTE — ED Triage Notes (Signed)
C/O leg swelling and pain on right side. Stated has had hip fx in the past but denies recent fall. C/O headache as well.

## 2021-07-02 NOTE — ED Provider Notes (Signed)
[facility] EMERGENCY DEPARTMENT Provider Note   CSN: [id number] Arrival date & time: 07/02/21  1346     History Chief Complaint  Patient presents with   Leg Swelling   Headache   Leg Pain    Martin Williams is a 55 y.o. male.   Headache Leg Pain  55 year old male PMHx right hip fracture status post IM nail placement 02/2021, presenting for bilateral lower extremity edema with RLE pain intermittently since the procedure.  Pain is focused on left hip, nonradiating, stabbing quality, worsened with movement, no significant improving factors.  Additionally reports that he has been tremulous for the last couple days with worsening bilateral lower extremity edema.  Uncertain etiology.  He was noted to have a sodium of 111 from triage labs here today.  Previous 130 at the end of 02/2021.  He additionally notes that he drinks 1 or 2 beers a day, as well as 1 or 2 glasses of water a day, no more.  He has additionally had urinary frequency for the last several days, stating "I have to pee every 20 to 30 minutes."  No head trauma recently.  No further medical concerns at this time including recent injury, fever, chills, sweats, CP, SOB, palpitations, cough, rhinorrhea, N/V, bowel changes, seizure, syncope, headache, audiovisual changes.  History obtained from patient and sister and chart review.     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  Social History   Tobacco Use   Smoking status: Every Day    Packs/day: 0.50    Types: Cigarettes   Smokeless tobacco: Never  Vaping Use   Vaping Use: Never used  Substance Use Topics   Alcohol use: Yes    Comment: occ   Drug use: Not Currently    Home Medications Prior to Admission medications   Medication Sig Start Date End Date Taking? Authorizing Provider  acetaminophen (TYLENOL) 500 MG tablet Take 1,000 mg by mouth every 6 (six) hours as needed for mild pain.   Yes [provider]  ferrous sulfate 325 (65 FE) MG tablet TAKE 1 TABLET (325 MG TOTAL) BY MOUTH DAILY. 03/16/21 03/16/22 Yes Simmons-Robinson, Makiera, MD  HYDROcodone-acetaminophen (NORCO/VICODIN) 5-325 MG tablet Take 1-2 tablets by mouth every 4 (four) hours as needed for moderate pain (pain score 4-6). 03/16/21  Yes Simmons-Robinson, Makiera, MD  methocarbamol (ROBAXIN) 500 MG tablet Take 500 mg by mouth every 6 (six) hours as needed for muscle spasms. 06/26/21  Yes [provider]  apixaban (ELIQUIS) 2.5 MG TABS tablet Take 1 tablet (2.5 mg total) by mouth 2 (two) times daily. Patient not taking: Reported on 07/02/2021 03/16/21   Simmons-Robinson, Tawanna Cooler, MD  apixaban (ELIQUIS) 2.5 MG TABS tablet TAKE 1 TABLET (2.5 MG TOTAL) BY MOUTH TWO TIMES DAILY. Patient not taking: Reported on 07/02/2021 03/16/21 03/16/22  Simmons-Robinson, Makiera, MD  cholecalciferol (VITAMIN D) 25 MCG tablet Take 2 tablets (2,000 Units total) by mouth daily. Patient not taking: Reported on 07/02/2021 03/17/21   Simmons-Robinson, Tawanna Cooler, MD  ferrous sulfate 325 (65 FE) MG tablet Take 1 tablet (325 mg total) by mouth daily. Patient not taking: Reported on 07/02/2021 03/16/21 07/02/21  Simmons-Robinson, Tawanna Cooler, MD  HYDROcodone-acetaminophen (NORCO/VICODIN) 5-325 MG tablet TAKE 1-2 TABLETS BY MOUTH EVERY FOUR HOURS AS NEEDED FOR MODERATE PAIN  (PAIN SCORE 4-6). Patient not taking: Reported on 07/02/2021 03/16/21 09/12/21  Simmons-Robinson, Tawanna Cooler, MD  polyethylene glycol (MIRALAX / GLYCOLAX) 17 g packet Take 17 g by mouth daily as needed for mild constipation. Patient not taking: Reported on 07/02/2021 03/16/21   Simmons-Robinson, Tawanna Cooler, MD  polyethylene glycol powder (GLYCOLAX/MIRALAX) 17 GM/SCOOP powder DISSOLVE 1 CAPFUL (17 G) IN WATER AND DRINK DAILY AS NEEDED FOR MILD CONSTIPATION. Patient not taking: Reported on 07/02/2021 03/16/21 03/16/22  Simmons-Robinson, Tawanna Cooler, MD  Vitamin D, Ergocalciferol, (DRISDOL) 1.25 MG (50000 UNIT) CAPS capsule TAKE 1 CAPSULE (50,000 UNITS TOTAL) BY MOUTH EVERY TUESDAY. Patient not taking: Reported on 07/02/2021 03/16/21 03/16/22  Simmons-Robinson, Tawanna Cooler, MD  Vitamin D3 (VITAMIN D) 25 MCG tablet TAKE 2 TABLETS (2,000 UNITS TOTAL) BY MOUTH DAILY. Patient not taking: Reported on 07/02/2021 03/16/21 03/16/22  Ronnald Ramp, MD    Allergies    Patient has no known allergies.  Review of Systems   Review of Systems  Neurological:  Positive for headaches.  All other systems reviewed and are negative.  Physical Exam Updated Vital Signs BP (!) 129/101 (BP Location: Right Arm)   Pulse 78   Temp 98.3 F (36.8 C) (Oral)   Resp 12   Ht 6\' 2"  (1.88 m)   Wt 80 kg   SpO2 100%   BMI 22.64 kg/m   Physical Exam Vitals and nursing note reviewed.  Constitutional:      General: He is not in acute distress. HENT:     Head: Normocephalic and atraumatic.  Eyes:     Extraocular Movements: Extraocular movements intact.     Pupils: Pupils are equal, round, and reactive to light. Pupils are equal.  Cardiovascular:     Rate and Rhythm: Normal rate and regular rhythm.     Heart sounds: No murmur heard.   No friction rub. No gallop.  Pulmonary:     Effort: Pulmonary effort is normal.     Breath sounds: No stridor. No wheezing, rhonchi or rales.  Abdominal:     Palpations: Abdomen is soft.     Tenderness:  There is no abdominal tenderness.  Musculoskeletal:        General: Normal range of motion.     Cervical back: Normal range of motion and neck supple. No rigidity.     Comments: Mild tenderness to right hip without erythema, induration, fluctuance, warmth.  Surgical scars to right lateral hip and right lateral knee, well-healed, noninfectious appearing.  Skin:    General: Skin is warm and dry.     Capillary Refill: Capillary refill takes less than 2 seconds.  Neurological:     Mental Status: He is alert.     GCS: GCS eye subscore is 4. GCS verbal subscore is 5. GCS motor subscore is 6.     Comments: Mental status: a&o x4 Speech: clear, no dysarthria CN II: visual fields grossly intact CN III/IV/VI: PERRL, EOMI CN V: facial sensation to LT and mastication intact CN VII: no facial droop CN VIII: no nystagmus, audition intact to  finger rub VN IX/X: swallow intact CN XI: trapezius and SCM motor function intact CN XII: midline tongue w/o atrophy, questionable fasciculation RUE: 5/5 strength, sensation to LT intact LUE: 5/5 strength, sensation to LT intact RLE: 5/5 strength, sensation to LT intact LLE: 5/5 strength, sensation to LT intact Coordination: BUE tremulous.  Finger-to-nose, foot alternation intact. No pronator drift or dysdiadochokinesia Gait: Not tested   Psychiatric:        Mood and Affect: Mood normal.        Behavior: Behavior normal.    ED Results / Procedures / Treatments   Labs (all labs ordered are listed, but only abnormal results are displayed) Labs Reviewed  CBC WITH DIFFERENTIAL/PLATELET - Abnormal; Notable for the following components:      Result Value   Hemoglobin 11.3 (*)    HCT 32.9 (*)    MCV 66.9 (*)    MCH 23.0 (*)    RDW 15.6 (*)    All other components within normal limits  BASIC METABOLIC PANEL - Abnormal; Notable for the following components:   Sodium 111 (*)    Chloride 75 (*)    BUN <5 (*)    Creatinine, Ser 0.47 (*)    All other  components within normal limits  CBC WITH DIFFERENTIAL/PLATELET - Abnormal; Notable for the following components:   Hemoglobin 10.6 (*)    HCT 30.3 (*)    MCV 65.6 (*)    MCH 22.9 (*)    Basophils Absolute 0.2 (*)    All other components within normal limits  URINALYSIS, ROUTINE W REFLEX MICROSCOPIC - Abnormal; Notable for the following components:   APPearance CLOUDY (*)    Ketones, ur 5 (*)    Nitrite POSITIVE (*)    Leukocytes,Ua SMALL (*)    Bacteria, UA FEW (*)    All other components within normal limits  HEPATIC FUNCTION PANEL - Abnormal; Notable for the following components:   AST 57 (*)    All other components within normal limits  OSMOLALITY - Abnormal; Notable for the following components:   Osmolality 235 (*)    All other components within normal limits  BASIC METABOLIC PANEL - Abnormal; Notable for the following components:   Sodium 111 (*)    Chloride 78 (*)    CO2 21 (*)    BUN <5 (*)    Creatinine, Ser 0.46 (*)    Calcium 8.8 (*)    All other components within normal limits  I-STAT CHEM 8, ED - Abnormal; Notable for the following components:   Sodium 109 (*)    Chloride 75 (*)    BUN 3 (*)    Creatinine, Ser 0.40 (*)    Calcium, Ion 1.10 (*)    All other components within normal limits  RESP PANEL BY RT-PCR (FLU A&B, COVID) ARPGX2  BRAIN NATRIURETIC PEPTIDE  LIPASE, BLOOD  ETHANOL  AMMONIA  OSMOLALITY, URINE  SODIUM, URINE, RANDOM  CBC  CREATININE, SERUM  CBC  MAGNESIUM  PHOSPHORUS  LIPID PANEL  RAPID URINE DRUG SCREEN, HOSP PERFORMED  BASIC METABOLIC PANEL  BASIC METABOLIC PANEL  BASIC METABOLIC PANEL  BASIC METABOLIC PANEL  BASIC METABOLIC PANEL    EKG None  Radiology DG Pelvis 1-2 Views  Result Date: 07/02/2021 CLINICAL DATA:  Pelvic pain post fall EXAM: PELVIS - 1-2 VIEW COMPARISON:  03/11/2021 FINDINGS: SI joints are non widened. Pubic symphysis and rami are intact. Interval operative fixation of right intertrochanteric fracture with  stable alignment. IMPRESSION: Operative fixation  of right femur fracture. No acute osseous abnormality Electronically Signed   By: Jasmine PangKim  Fujinaga M.D.   On: 07/02/2021 17:04   CT Head Wo Contrast  Result Date: 07/02/2021 CLINICAL DATA:  Seizure. Abnormal neuro exam. Tremors. Hyponatremia. EXAM: CT HEAD WITHOUT CONTRAST TECHNIQUE: Contiguous axial images were obtained from the base of the skull through the vertex without intravenous contrast. COMPARISON:  None. FINDINGS: Brain: Patchy and confluent areas of decreased attenuation are noted throughout the deep and periventricular white matter of the cerebral hemispheres bilaterally, compatible with chronic microvascular ischemic disease. No evidence of large-territorial acute infarction. No parenchymal hemorrhage. No mass lesion. No extra-axial collection. No mass effect or midline shift. No hydrocephalus. Basilar cisterns are patent. Vascular: No hyperdense vessel. Skull: No acute fracture or focal lesion. Sinuses/Orbits: Paranasal sinuses and mastoid air cells are clear. The orbits are unremarkable. Other: None. IMPRESSION: No acute intracranial abnormality. Electronically Signed   By: Tish FredericksonMorgane  Naveau M.D.   On: 07/02/2021 21:56   DG Chest Port 1 View  Result Date: 07/02/2021 CLINICAL DATA:  Hyponatremia EXAM: PORTABLE CHEST 1 VIEW COMPARISON:  03/10/2021 FINDINGS: The heart size and mediastinal contours are within normal limits. Both lungs are clear. The visualized skeletal structures are unremarkable. IMPRESSION: No active disease. Electronically Signed   By: Helyn NumbersAshesh  Parikh MD   On: 07/02/2021 22:42   DG Femur Min 2 Views Right  Result Date: 07/02/2021 CLINICAL DATA:  Right leg and pelvic pain post fall surgery in March EXAM: RIGHT FEMUR 2 VIEWS COMPARISON:  03/11/2021, 03/10/2021 FINDINGS: Status post intramedullary rodding and distal screw fixation of right intertrochanteric fracture with stable appearance of hardware and some interval callus  formation. Mild periosteal new bone formation at the distal screw tips. No acute fracture is seen IMPRESSION: Status post intramedullary rodding of right intertrochanteric fracture with grossly stable alignment and some interval callus. No definite acute osseous abnormality Electronically Signed   By: Jasmine PangKim  Fujinaga M.D.   On: 07/02/2021 17:03   VAS US LOWER EXTREMITY VENOUS (DVT) (ONLY [facility])  Result Date: 07/02/2021  Lower Venous DVT Study Patient Name:  Martin Williams  Date of Exam:   07/02/2021 Medical Rec #: [id number]       Accession #:    [id number] Date of Birth: 02/19/1966        Patient Gender: M Patient Age:   55Y [facility] Procedure:      VAS US LOWER EXTREMITY VENOUS (DVT) Referring Phys: [id number] Mannie StabileAROLINE C ABERMAN --------------------------------------------------------------------------------  Indications: Edema, and Pain.  Comparison Study: No prior study Performing Technologist: Gertie FeyMichelle Simonetti MHA, RDMS, RVT, RDCS  Examination Guidelines: A complete evaluation includes B-mode imaging, spectral Doppler, color Doppler, and power Doppler as needed of all accessible portions of each vessel. Bilateral testing is considered an integral part of a complete examination. Limited examinations for reoccurring indications may be performed as noted. The reflux portion of the exam is performed with the patient in reverse Trendelenburg.  +---------+---------------+---------+-----------+----------+--------------+ RIGHT    CompressibilityPhasicitySpontaneityPropertiesThrombus Aging +---------+---------------+---------+-----------+----------+--------------+ CFV      Full           Yes      Yes                                 +---------+---------------+---------+-----------+----------+--------------+ SFJ      Full                                                        +---------+---------------+---------+-----------+----------+--------------+  FV Prox  Full                                                         +---------+---------------+---------+-----------+----------+--------------+ FV Mid   Full                                                        +---------+---------------+---------+-----------+----------+--------------+ FV DistalFull                                                        +---------+---------------+---------+-----------+----------+--------------+ PFV      Full                                                        +---------+---------------+---------+-----------+----------+--------------+ POP      Full           Yes      Yes                                 +---------+---------------+---------+-----------+----------+--------------+ PTV      Full                                                        +---------+---------------+---------+-----------+----------+--------------+ PERO     Full                                                        +---------+---------------+---------+-----------+----------+--------------+   +---------+---------------+---------+-----------+----------+--------------+ LEFT     CompressibilityPhasicitySpontaneityPropertiesThrombus Aging +---------+---------------+---------+-----------+----------+--------------+ CFV      Full           Yes      Yes                                 +---------+---------------+---------+-----------+----------+--------------+ SFJ      Full                                                        +---------+---------------+---------+-----------+----------+--------------+ FV Prox  Full                                                        +---------+---------------+---------+-----------+----------+--------------+  FV Mid   Full                                                        +---------+---------------+---------+-----------+----------+--------------+ FV DistalFull                                                         +---------+---------------+---------+-----------+----------+--------------+ PFV      Full                                                        +---------+---------------+---------+-----------+----------+--------------+ POP      Full           Yes      Yes                                 +---------+---------------+---------+-----------+----------+--------------+ PTV      Full                                                        +---------+---------------+---------+-----------+----------+--------------+ PERO     Full                                                        +---------+---------------+---------+-----------+----------+--------------+     Summary: BILATERAL: - No evidence of deep vein thrombosis seen in the lower extremities, bilaterally. -No evidence of popliteal cyst, bilaterally.   *See table(s) above for measurements and observations. Electronically signed by Fabienne Bruns MD on 07/02/2021 at 7:48:08 PM.    Final     Procedures Procedures   Medications Ordered in ED Medications  cefTRIAXone (ROCEPHIN) 1 g in sodium chloride 0.9 % 100 mL IVPB (0 g Intravenous Stopped 07/03/21 0110)  docusate sodium (COLACE) capsule 100 mg (has no administration in time range)  polyethylene glycol (MIRALAX / GLYCOLAX) packet 17 g (has no administration in time range)  heparin injection 5,000 Units (5,000 Units Subcutaneous Patient Refused/Not Given 07/03/21 0050)  famotidine (PEPCID) tablet 20 mg (20 mg Oral Patient Refused/Not Given 07/03/21 0050)  enalaprilat (VASOTEC) injection 0.625 mg (has no administration in time range)    ED Course  I have reviewed the triage vital signs and the nursing notes.  Pertinent labs & imaging results that were available during my care of the patient were reviewed by me and considered in my medical decision making (see chart for details).    MDM Rules/Calculators/A&P                          This is a 55 year old male status post  right hip  fracture with intramedullary nailing 3 months ago, with persistent right hip pain, presenting for tremors, urinary frequency, bilateral lower extremity edema for the last couple days.  Triage sodium 111.  Mildly hypertensive, otherwise VSS and AF.  Bilateral lower extremity pitting edema.  States that he does not drink very much (1-2 beers, and 1 to 2 glasses of water per day).  No head injury, headache, focal weakness/paresthesias, nor audiovisual changes.  Initial interventions: Hyponatremia resuscitation protocol initiated, hypertonic saline drip started  DDx included: CNS lesion, drug toxicity, polydipsia, diabetes insipidus, metabolic derangement, DVT, venous stasis, wound infection, osteomyelitis, septic arthritis, soft tissue infection  All studies independently reviewed by myself, d/w the attending physician, factored into my MDM. -EKG: NSR 75 bpm, normal axis, normal intervals, no acute ST T changes; T waves appear taller compared to prior from 02/2021 -BMP: Na+ 111, Cl- 75, BUN <5, creatinine 0.47; confirmed on i-STAT Chem-8 -UA: Nitrite positive, cloudy, spec gravity 1.013 -Unremarkable: CT head noncontrast, XR right femur/pelvis, CXR, CBCd, BNP, LFTs, COVID/influenza PCR, venous duplex BLE  Uncertain of the etiology of patient's significant hyponatremia at this time.  No obvious CNS lesion on CT head.  No medication changes or exogenous drug use per patient.  Patient specifically reports that he drinks a little, does not appear to be engaging polydipsia.  Suspect frequent urination 2/2 UTI, unlikely related to diabetes insipidus in context of his hyponatremia.  Negative DVT study.  Most likely venous stasis given acuity of onset.  No evidence of wound infection on my exam, reassuring imaging.  ICU was contacted for admission for significant hyponatremia, agreed to admit the patient.  Plan was discussed with patient family understand and agree.  Patient HDS on reevaluation  subsequently admitted to ICU.  Final Clinical Impression(s) / ED Diagnoses Final diagnoses:  Peripheral edema  Hyponatremia  Tremor  Right hip pain    Rx / DC Orders ED Discharge Orders     None        Colvin Caroli, MD 07/03/21 2694    Blane Ohara, MD 07/04/21 0023

## 2021-07-03 LAB — ETHANOL: Alcohol, Ethyl (B): <10 mg/dL (ref <10)

## 2021-07-03 LAB — OSMOLALITY: Osmolality: 235 mOsm/kg — CL (ref 275–295)

## 2021-07-03 LAB — CBC
HCT: 31.4 % — ABNORMAL LOW (ref 39.0–52.0)
Hemoglobin: 10.4 g/dL — ABNORMAL LOW (ref 13.0–17.0)
MCH: 22.7 pg — ABNORMAL LOW (ref 26.0–34.0)
MCHC: 33.1 g/dL (ref 30.0–36.0)
MCV: 68.6 fL — ABNORMAL LOW (ref 80.0–100.0)
Platelets: 256 10*3/uL (ref 150–400)
RBC: 4.58 MIL/uL (ref 4.22–5.81)
RDW: 15.5 % (ref 11.5–15.5)
WBC: 2.9 10*3/uL — ABNORMAL LOW (ref 4.0–10.5)
nRBC: 0 % (ref 0.0–0.2)

## 2021-07-03 LAB — BASIC METABOLIC PANEL
Anion gap: 12 (ref 5–15)
Anion gap: 13 (ref 5–15)
Anion gap: 10 (ref 5–15)
Anion gap: 10 (ref 5–15)
Anion gap: 15 (ref 5–15)
Anion gap: 7 (ref 5–15)
BUN: <5 mg/dL — ABNORMAL LOW (ref 6–20)
BUN: <5 mg/dL — ABNORMAL LOW (ref 6–20)
BUN: <5 mg/dL — ABNORMAL LOW (ref 6–20)
BUN: 10 mg/dL (ref 6–20)
BUN: 32 mg/dL — ABNORMAL HIGH (ref 6–20)
BUN: 5 mg/dL — ABNORMAL LOW (ref 6–20)
CO2: 21 mmol/L — ABNORMAL LOW (ref 22–32)
CO2: 21 mmol/L — ABNORMAL LOW (ref 22–32)
CO2: 22 mmol/L (ref 22–32)
CO2: 22 mmol/L (ref 22–32)
CO2: 16 mmol/L — ABNORMAL LOW (ref 22–32)
CO2: 23 mmol/L (ref 22–32)
Calcium: 8.8 mg/dL — ABNORMAL LOW (ref 8.9–10.3)
Calcium: 9.1 mg/dL (ref 8.9–10.3)
Calcium: 8.7 mg/dL — ABNORMAL LOW (ref 8.9–10.3)
Calcium: 8.7 mg/dL — ABNORMAL LOW (ref 8.9–10.3)
Calcium: 8.9 mg/dL (ref 8.9–10.3)
Calcium: 8.7 mg/dL — ABNORMAL LOW (ref 8.9–10.3)
Chloride: 78 mmol/L — ABNORMAL LOW (ref 98–111)
Chloride: 79 mmol/L — ABNORMAL LOW (ref 98–111)
Chloride: 78 mmol/L — ABNORMAL LOW (ref 98–111)
Chloride: 80 mmol/L — ABNORMAL LOW (ref 98–111)
Chloride: 82 mmol/L — ABNORMAL LOW (ref 98–111)
Chloride: 80 mmol/L — ABNORMAL LOW (ref 98–111)
Creatinine, Ser: 0.46 mg/dL — ABNORMAL LOW (ref 0.61–1.24)
Creatinine, Ser: 0.5 mg/dL — ABNORMAL LOW (ref 0.61–1.24)
Creatinine, Ser: 0.54 mg/dL — ABNORMAL LOW (ref 0.61–1.24)
Creatinine, Ser: 0.51 mg/dL — ABNORMAL LOW (ref 0.61–1.24)
Creatinine, Ser: 0.5 mg/dL — ABNORMAL LOW (ref 0.61–1.24)
Creatinine, Ser: 0.51 mg/dL — ABNORMAL LOW (ref 0.61–1.24)
GFR, Estimated: >60 mL/min (ref >60)
GFR, Estimated: >60 mL/min (ref >60)
GFR, Estimated: >60 mL/min (ref >60)
GFR, Estimated: >60 mL/min (ref >60)
GFR, Estimated: >60 mL/min (ref >60)
GFR, Estimated: >60 mL/min (ref >60)
Glucose, Bld: 82 mg/dL (ref 70–99)
Glucose, Bld: 72 mg/dL (ref 70–99)
Glucose, Bld: 93 mg/dL (ref 70–99)
Glucose, Bld: 77 mg/dL (ref 70–99)
Glucose, Bld: 87 mg/dL (ref 70–99)
Glucose, Bld: 72 mg/dL (ref 70–99)
Potassium: 3.8 mmol/L (ref 3.5–5.1)
Potassium: 4.3 mmol/L (ref 3.5–5.1)
Potassium: 3.6 mmol/L (ref 3.5–5.1)
Potassium: 3.8 mmol/L (ref 3.5–5.1)
Potassium: 3.8 mmol/L (ref 3.5–5.1)
Potassium: 4.5 mmol/L (ref 3.5–5.1)
Sodium: 111 mmol/L — CL (ref 135–145)
Sodium: 113 mmol/L — CL (ref 135–145)
Sodium: 110 mmol/L — CL (ref 135–145)
Sodium: 112 mmol/L — CL (ref 135–145)
Sodium: 113 mmol/L — CL (ref 135–145)
Sodium: 110 mmol/L — CL (ref 135–145)

## 2021-07-03 LAB — RAPID URINE DRUG SCREEN, HOSP PERFORMED
Amphetamines: NOT DETECTED
Barbiturates: NOT DETECTED
Benzodiazepines: NOT DETECTED
Cocaine: NOT DETECTED
Opiates: NOT DETECTED
Tetrahydrocannabinol: NOT DETECTED

## 2021-07-03 LAB — LIPID PANEL
Cholesterol: 145 mg/dL (ref 0–200)
HDL: 90 mg/dL (ref >40)
LDL Cholesterol: 46 mg/dL (ref 0–99)
Total CHOL/HDL Ratio: 1.6 RATIO
Triglycerides: 47 mg/dL (ref <150)
VLDL: 9 mg/dL (ref 0–40)

## 2021-07-03 LAB — OSMOLALITY, URINE: Osmolality, Ur: 439 mOsm/kg (ref 300–900)

## 2021-07-03 LAB — AMMONIA: Ammonia: 28 umol/L (ref 9–35)

## 2021-07-03 LAB — GLUCOSE, CAPILLARY: Glucose-Capillary: 86 mg/dL (ref 70–99)

## 2021-07-03 LAB — PHOSPHORUS: Phosphorus: 3.2 mg/dL (ref 2.5–4.6)

## 2021-07-03 LAB — MAGNESIUM: Magnesium: 1.6 mg/dL — ABNORMAL LOW (ref 1.7–2.4)

## 2021-07-03 LAB — MRSA NEXT GEN BY PCR, NASAL: MRSA by PCR Next Gen: NOT DETECTED

## 2021-07-03 LAB — LIPASE, BLOOD: Lipase: 31 U/L (ref 11–51)

## 2021-07-03 LAB — SODIUM, URINE, RANDOM: Sodium, Ur: 105 mmol/L

## 2021-07-03 MED ORDER — UREA 15 G PO PACK
15.0000 g | PACK | Freq: Two times a day (BID) | ORAL | Status: DC
  Administered 2021-07-03 – 2021-07-04 (×2): 15 g via ORAL
  Filled 2021-07-03 (×3): qty 1

## 2021-07-03 MED ORDER — HYDRALAZINE HCL 10 MG PO TABS
10.0000 mg | ORAL_TABLET | Freq: Four times a day (QID) | ORAL | Status: DC | PRN
  Administered 2021-07-03: 10 mg via ORAL
  Filled 2021-07-03: qty 1

## 2021-07-03 MED ORDER — UREA 15 G PO PACK
15.0000 g | PACK | Freq: Once | ORAL | Status: AC
  Administered 2021-07-03: 15 g via ORAL
  Filled 2021-07-03: qty 1

## 2021-07-03 MED ORDER — CHLORHEXIDINE GLUCONATE CLOTH 2 % EX PADS
6.0000 | MEDICATED_PAD | Freq: Every day | CUTANEOUS | Status: DC
  Administered 2021-07-03 – 2021-07-07 (×6): 6 via TOPICAL

## 2021-07-03 MED ORDER — SODIUM CHLORIDE 0.9 % IV SOLN: INTRAVENOUS | Status: DC

## 2021-07-03 NOTE — Progress Notes (Addendum)
NAME:  Martin Williams, MRN:  [id number], DOB:  1966-06-30, LOS: 1 ADMISSION DATE:  07/02/2021, CONSULTATION DATE:  07/02/2021 REFERRING MD:  Dr. Ronette Deter, CHIEF COMPLAINT:  Hyponatremia   History of Present Illness:  This is a 55 year old African American male who presented to [facility] Emergency Department with complaints of RLE pain and swelling and was found to be hyponatremic to 109. Reports he had nothing to eat in the day prior to admission and vomited 3 times. Drinks 3-4 glasses of water daily and a 40oz beer every other day.  In the ED, workup included CT head, CXR, and DVT ultrasound, all of which were normal. He was deemed to be tremulous, so PCCM asked to admit for symptomatic, acute, severe hyponatremia. He was briefly started on hypertonic saline in the ED, which was then stopped on admission.  Pertinent  Medical History  Tobacco Use Alcohol Use R hip fracture s/p ORIF 03/11/21  Significant Hospital Events: Including procedures, antibiotic start and stop dates in addition to other pertinent events   7/18: admitted, started Ceftriaxone for UTI  Interim History / Subjective:  Patient reports mild right hip pain this morning but otherwise denies complaints. States he initially presented due to pain and swelling in his right leg but the swelling is gone now. Endorses poor dietary intake at baseline and 3 episodes of vomiting the day of admission. Drinks 3-4 glasses of water daily and a 40oz beer every other day.  Objective   Blood pressure (!) 175/80, pulse 72, temperature 98.6 F (37 C), temperature source Oral, resp. rate 17, height 6\' 2"  (1.88 m), weight 80 kg, SpO2 98 %.        Intake/Output Summary (Last 24 hours) at 07/03/2021 1055 Last data filed at 07/03/2021 07/05/2021 Gross per 24 hour  Intake 225.43 ml  Output 700 ml  Net -474.57 ml   Filed Weights   07/02/21 1512  Weight: 80 kg    Examination: General: alert, cachectic appearing male, NAD HENT: [place]/AT, MM Lungs:  normal effort, lungs CTAB Cardiovascular: RRR, normal S1/S2 without m/r/g Abdomen: +BS, soft, nontender Extremities: no peripheral edema Neuro: alert, oriented x3, CN II-XII intact, moves all extremities equally, gross sensation intact throughout, normal speech  Assessment & Plan:   Acute on Chronic Hyponatremia Na 109 on admission (from 130 in March 2022) with serum osmolality 235. Na initially improved to 113 but then downtrended to 110. Received April 2022 hypertonic saline in the ED, then switched to fluid restriction. Etiology unclear- possibly poor intake, beer potomania or primary polydipsia although patient reports only drinking 3-4 glasses of water and 40oz beer every other day. P: -Normal saline at 149mL/hr -q4h BMP -Neuro checks, seizure precautions -f/u urine osmolality and urine sodium -Monitor UOP  UTI UA on admission with positive nitrite, small leuks. P: -Continue Ceftriaxone  HTN BP intermittently elevated. No known hx of HTN. P: -PO hydralazine 10mg  prn for SBP >160  Alcohol Use Ethanol <10 on admission. No evidence of withdrawal currently. P: -CIWA monitoring -f/u UDS results  Best Practice (right click and "Reselect all SmartList Selections" daily)   Diet/type: Regular consistency (see orders) DVT prophylaxis: prophylactic heparin  GI prophylaxis: PPI Lines: N/A Foley:  N/A Code Status:  full code  Labs   CBC: Recent Labs  Lab 07/02/21 1551 07/02/21 2006 07/02/21 2013 07/03/21 0500  WBC 5.3 4.6  --  2.9*  NEUTROABS 3.2 2.7  --   --   HGB 11.3* 10.6* 13.3 10.4*  HCT 32.9* 30.3*  39.0 31.4*  MCV 66.9* 65.6*  --  68.6*  PLT 280 266  --  256    Basic Metabolic Panel: Recent Labs  Lab 07/02/21 1551 07/02/21 2013 07/03/21 0052 07/03/21 0500 07/03/21 0735  NA 111* 109* 111* 113* 110*  K 3.7 4.1 3.8 4.3 4.5  CL 75* 75* 78* 79* 80*  CO2 24  --  21* 21* 23  GLUCOSE 89 94 82 72 72  BUN <5* 3* <5* <5* 5*  CREATININE 0.47* 0.40* 0.46* 0.50*  0.51*  CALCIUM 9.1  --  8.8* 9.1 8.7*  MG  --   --   --  1.6*  --   PHOS  --   --   --  3.2  --    GFR: Estimated Creatinine Clearance: 116.7 mL/min (A) (by C-G formula based on SCr of 0.51 mg/dL (L)). Recent Labs  Lab 07/02/21 1551 07/02/21 2006 07/03/21 0500  WBC 5.3 4.6 2.9*    Liver Function Tests: Recent Labs  Lab 07/02/21 1933  AST 57*  ALT 29  ALKPHOS 80  BILITOT 0.8  PROT 7.7  ALBUMIN 3.6   Recent Labs  Lab 07/03/21 0052  LIPASE 31   Recent Labs  Lab 07/03/21 0052  AMMONIA 28    ABG    Component Value Date/Time   TCO2 23 07/02/2021 2013     Coagulation Profile: No results for input(s): INR, PROTIME in the last 168 hours.  Cardiac Enzymes: No results for input(s): CKTOTAL, CKMB, CKMBINDEX, TROPONINI in the last 168 hours.  HbA1C: No results found for: HGBA1C  CBG: Recent Labs  Lab 07/03/21 1020  GLUCAP 86    Past Medical History:  He,  has a past medical history of Tobacco abuse.   Surgical History:   Past Surgical History:  Procedure Laterality Date   INTRAMEDULLARY (IM) NAIL INTERTROCHANTERIC Right 03/11/2021   Procedure: INTRAMEDULLARY (IM) NAIL INTERTROCHANTRIC;  Surgeon: Roby Lofts, MD;  Location: [facility] OR;  Service: Orthopedics;  Laterality: Right;   LAPAROSCOPIC GASTROTOMY W/ REPAIR OF ULCER       Social History:   reports that he has been smoking. He has been smoking an average of .5 packs per day. He has never used smokeless tobacco. He reports current alcohol use. He reports previous drug use.   Family History:  His family history is not on file.   Allergies No Known Allergies   Home Medications  Prior to Admission medications   Medication Sig Start Date End Date Taking? Authorizing Provider  acetaminophen (TYLENOL) 500 MG tablet Take 1,000 mg by mouth every 6 (six) hours as needed for mild pain.   Yes [provider]  ferrous sulfate 325 (65 FE) MG tablet TAKE 1 TABLET (325 MG TOTAL) BY MOUTH DAILY. 03/16/21  03/16/22 Yes Simmons-Robinson, Makiera, MD  HYDROcodone-acetaminophen (NORCO/VICODIN) 5-325 MG tablet Take 1-2 tablets by mouth every 4 (four) hours as needed for moderate pain (pain score 4-6). 03/16/21  Yes Simmons-Robinson, Makiera, MD  methocarbamol (ROBAXIN) 500 MG tablet Take 500 mg by mouth every 6 (six) hours as needed for muscle spasms. 06/26/21  Yes [provider]  apixaban (ELIQUIS) 2.5 MG TABS tablet Take 1 tablet (2.5 mg total) by mouth 2 (two) times daily. Patient not taking: Reported on 07/02/2021 03/16/21   Simmons-Robinson, Tawanna Cooler, MD  apixaban (ELIQUIS) 2.5 MG TABS tablet TAKE 1 TABLET (2.5 MG TOTAL) BY MOUTH TWO TIMES DAILY. Patient not taking: Reported on 07/02/2021 03/16/21 03/16/22  Ronnald Ramp, MD  cholecalciferol (  VITAMIN D) 25 MCG tablet Take 2 tablets (2,000 Units total) by mouth daily. Patient not taking: Reported on 07/02/2021 03/17/21   Simmons-Robinson, Tawanna Cooler, MD  ferrous sulfate 325 (65 FE) MG tablet Take 1 tablet (325 mg total) by mouth daily. Patient not taking: Reported on 07/02/2021 03/16/21 07/02/21  Simmons-Robinson, Tawanna Cooler, MD  HYDROcodone-acetaminophen (NORCO/VICODIN) 5-325 MG tablet TAKE 1-2 TABLETS BY MOUTH EVERY FOUR HOURS AS NEEDED FOR MODERATE PAIN (PAIN SCORE 4-6). Patient not taking: Reported on 07/02/2021 03/16/21 09/12/21  Simmons-Robinson, Tawanna Cooler, MD  polyethylene glycol (MIRALAX / GLYCOLAX) 17 g packet Take 17 g by mouth daily as needed for mild constipation. Patient not taking: Reported on 07/02/2021 03/16/21   Simmons-Robinson, Tawanna Cooler, MD  polyethylene glycol powder (GLYCOLAX/MIRALAX) 17 GM/SCOOP powder DISSOLVE 1 CAPFUL (17 G) IN WATER AND DRINK DAILY AS NEEDED FOR MILD CONSTIPATION. Patient not taking: Reported on 07/02/2021 03/16/21 03/16/22  Simmons-Robinson, Tawanna Cooler, MD  Vitamin D, Ergocalciferol, (DRISDOL) 1.25 MG (50000 UNIT) CAPS capsule TAKE 1 CAPSULE (50,000 UNITS TOTAL) BY MOUTH EVERY TUESDAY. Patient not taking: Reported on 07/02/2021  03/16/21 03/16/22  Simmons-Robinson, Tawanna Cooler, MD  Vitamin D3 (VITAMIN D) 25 MCG tablet TAKE 2 TABLETS (2,000 UNITS TOTAL) BY MOUTH DAILY. Patient not taking: Reported on 07/02/2021 03/16/21 03/16/22  Simmons-Robinson, Tawanna Cooler, MD     Maury Dus, MD PGY-2 [facility]

## 2021-07-03 NOTE — ED Notes (Signed)
Attempted to give reportx1 

## 2021-07-04 DIAGNOSIS — L8990 Pressure ulcer of unspecified site, unspecified stage: Secondary | ICD-10-CM | POA: Insufficient documentation

## 2021-07-04 DIAGNOSIS — E43 Unspecified severe protein-calorie malnutrition: Secondary | ICD-10-CM | POA: Insufficient documentation

## 2021-07-04 LAB — BASIC METABOLIC PANEL
Anion gap: 8 (ref 5–15)
Anion gap: 10 (ref 5–15)
Anion gap: 5 (ref 5–15)
Anion gap: 8 (ref 5–15)
BUN: 14 mg/dL (ref 6–20)
BUN: 13 mg/dL (ref 6–20)
BUN: 8 mg/dL (ref 6–20)
BUN: 19 mg/dL (ref 6–20)
CO2: 21 mmol/L — ABNORMAL LOW (ref 22–32)
CO2: 18 mmol/L — ABNORMAL LOW (ref 22–32)
CO2: 20 mmol/L — ABNORMAL LOW (ref 22–32)
CO2: 23 mmol/L (ref 22–32)
Calcium: 8.9 mg/dL (ref 8.9–10.3)
Calcium: 8.8 mg/dL — ABNORMAL LOW (ref 8.9–10.3)
Calcium: 8.7 mg/dL — ABNORMAL LOW (ref 8.9–10.3)
Calcium: 9.3 mg/dL (ref 8.9–10.3)
Chloride: 89 mmol/L — ABNORMAL LOW (ref 98–111)
Chloride: 90 mmol/L — ABNORMAL LOW (ref 98–111)
Chloride: 94 mmol/L — ABNORMAL LOW (ref 98–111)
Chloride: 92 mmol/L — ABNORMAL LOW (ref 98–111)
Creatinine, Ser: 0.52 mg/dL — ABNORMAL LOW (ref 0.61–1.24)
Creatinine, Ser: 0.56 mg/dL — ABNORMAL LOW (ref 0.61–1.24)
Creatinine, Ser: 0.49 mg/dL — ABNORMAL LOW (ref 0.61–1.24)
Creatinine, Ser: 0.53 mg/dL — ABNORMAL LOW (ref 0.61–1.24)
GFR, Estimated: >60 mL/min (ref >60)
GFR, Estimated: >60 mL/min (ref >60)
GFR, Estimated: >60 mL/min (ref >60)
GFR, Estimated: >60 mL/min (ref >60)
Glucose, Bld: 85 mg/dL (ref 70–99)
Glucose, Bld: 77 mg/dL (ref 70–99)
Glucose, Bld: 132 mg/dL — ABNORMAL HIGH (ref 70–99)
Glucose, Bld: 95 mg/dL (ref 70–99)
Potassium: 3.5 mmol/L (ref 3.5–5.1)
Potassium: 5.4 mmol/L — ABNORMAL HIGH (ref 3.5–5.1)
Potassium: 3.5 mmol/L (ref 3.5–5.1)
Potassium: 4 mmol/L (ref 3.5–5.1)
Sodium: 118 mmol/L — CL (ref 135–145)
Sodium: 118 mmol/L — CL (ref 135–145)
Sodium: 119 mmol/L — CL (ref 135–145)
Sodium: 123 mmol/L — ABNORMAL LOW (ref 135–145)

## 2021-07-04 MED ORDER — BOOST / RESOURCE BREEZE PO LIQD CUSTOM
1.0000 | Freq: Three times a day (TID) | ORAL | Status: DC
  Administered 2021-07-04 – 2021-07-07 (×9): 1 via ORAL

## 2021-07-04 MED ORDER — NICOTINE 14 MG/24HR TD PT24
14.0000 mg | MEDICATED_PATCH | Freq: Every day | TRANSDERMAL | Status: DC
  Administered 2021-07-04 – 2021-07-07 (×4): 14 mg via TRANSDERMAL
  Filled 2021-07-04 (×4): qty 1

## 2021-07-04 MED ORDER — POTASSIUM CHLORIDE CRYS ER 20 MEQ PO TBCR
40.0000 meq | EXTENDED_RELEASE_TABLET | Freq: Once | ORAL | Status: AC
  Administered 2021-07-04: 40 meq via ORAL
  Filled 2021-07-04: qty 2

## 2021-07-04 MED ORDER — ADULT MULTIVITAMIN W/MINERALS CH
1.0000 | ORAL_TABLET | Freq: Every day | ORAL | Status: DC
  Administered 2021-07-04 – 2021-07-07 (×4): 1 via ORAL
  Filled 2021-07-04 (×4): qty 1

## 2021-07-04 NOTE — Progress Notes (Signed)
[facility] ADULT ICU REPLACEMENT PROTOCOL   The patient does apply for the [facility] Adult ICU Electrolyte Replacment Protocol based on the criteria listed below:   1.Exclusion criteria: TCTS patients, ECMO patients and Hypothermia Protocol, and   Dialysis patients 2. Is GFR >/= 30 ml/min? Yes.    Patient's GFR today is >60 3. Is SCr </= 2? Yes.   Patient's SCr is 0.52mg /dL 4. Did SCr increase >/= 0.5 in 24 hours? No. 5.Pt's weight >40kg  Yes.   6. Abnormal electrolyte  K 3.5 7. Electrolytes replaced per protocol  Ardelle Park 07/04/2021 5:50 AM

## 2021-07-04 NOTE — Progress Notes (Signed)
NAME:  Martin Williams, MRN:  [id number], DOB:  [birth year], LOS: 2 ADMISSION DATE:  07/02/2021, CONSULTATION DATE:  07/02/2021 REFERRING MD:  Dr Ronette Deter CHIEF COMPLAINT:  Hyponatremia   History of Present Illness:  This is a 56 year old African American male who presented to [facility] Emergency Department with complaints of RLE pain and swelling and was found to be hyponatremic to 109. Reports he had nothing to eat in the day prior to admission and vomited 3 times. Drinks 3-4 glasses of water daily and a 40oz beer every other day. In the ED, workup included CT head, CXR, and DVT ultrasound, all of which were normal. He was deemed to be tremulous, so PCCM asked to admit for symptomatic, acute, severe hyponatremia. He was briefly started on hypertonic saline in the ED, which was then stopped on admission.  Pertinent  Medical History  R hip fracture s/p ORIF 03/11/2021 Alcohol use Tobacco use  Significant Hospital Events: Including procedures, antibiotic start and stop dates in addition to other pertinent events   7/18: admitted, started Ceftriaxone for UTI, briefly placed on hypertonic saline 7/20: Stable for transfer out of ICU  Interim History / Subjective:  Patient wanting to leave the hospital. States he feels well. He is very disgruntled with being here. States he can fix his sodium at home if he eats.  Objective   Blood pressure (!) 122/95, pulse 83, temperature 98.2 F (36.8 C), temperature source Oral, resp. rate 12, height 6\' 2"  (1.88 m), weight 80 kg, SpO2 100 %.        Intake/Output Summary (Last 24 hours) at 07/04/2021 0750 Last data filed at 07/04/2021 0600 Gross per 24 hour  Intake 3767.6 ml  Output 3750 ml  Net 17.6 ml   Filed Weights   07/02/21 1512  Weight: 80 kg    Examination: General: alert, NAD HENT: [place]/AT, MM Lungs: normal effort, lungs CTAB Cardiovascular: RRR, normal S1/S2 without m/r/g Abdomen: +BS, soft, nontender, nondistended Extremities: no peripheral  edema, no tremulousness Neuro: alert, oriented x3, 5/5 strength in all extremities Psych: calm but disgruntled  Resolved Hospital Problem list   R lower extremity edema  Assessment & Plan:  Hyponatremia Acute on chronic, likely due to poor intake ("tea and toast") and EtOH use. Improved to 118 this morning. Remains asymptomatic, mentating appropriately. P: -Continue normal saline at 125 mL/hr -Urea tablets BID -07/04/21 oral fluid restriction -q4h BMP -Stable for transfer to progressive  UTI Urine culture not sent initially. It is ordered, but will be collected after several doses of abx. P: -Continue CTX daily   HTN Normotensive over past 12h.  P: -prn hydralazine for SBP >160  EtOH use No objective signs of withdrawal other than mild anxiety/agitation. P: -CIWA monitoring  Tobacco use P: -Nicotine patch  Best Practice (right click and "Reselect all SmartList Selections" daily)   Diet/type: Regular consistency (see orders) DVT prophylaxis: prophylactic heparin  GI prophylaxis: N/A Lines: PIV Foley:  N/A Code Status:  full code  Labs   CBC: Recent Labs  Lab 07/02/21 1551 07/02/21 2006 07/02/21 2013 07/03/21 0500  WBC 5.3 4.6  --  2.9*  NEUTROABS 3.2 2.7  --   --   HGB 11.3* 10.6* 13.3 10.4*  HCT 32.9* 30.3* 39.0 31.4*  MCV 66.9* 65.6*  --  68.6*  PLT 280 266  --  256    Basic Metabolic Panel: Recent Labs  Lab 07/03/21 0500 07/03/21 0735 07/03/21 1248 07/03/21 1620 07/03/21 1930 07/04/21 0311 07/04/21 07/06/21  NA 113*   < > 110* 112* 113* 118* 118*  K 4.3   < > 3.6 3.8 3.8 3.5 5.4*  CL 79*   < > 78* 80* 82* 89* 90*  CO2 21*   < > 22 22 16* 21* 18*  GLUCOSE 72   < > 93 77 87 85 77  BUN <5*   < > <5* 10 32* 14 13  CREATININE 0.50*   < > 0.54* 0.51* 0.50* 0.52* 0.56*  CALCIUM 9.1   < > 8.7* 8.7* 8.9 8.9 8.8*  MG 1.6*  --   --   --   --   --   --   PHOS 3.2  --   --   --   --   --   --    < > = values in this interval not displayed.    GFR: Estimated Creatinine Clearance: 116.7 mL/min (A) (by C-G formula based on SCr of 0.56 mg/dL (L)). Recent Labs  Lab 07/02/21 1551 07/02/21 2006 07/03/21 0500  WBC 5.3 4.6 2.9*    Liver Function Tests: Recent Labs  Lab 07/02/21 1933  AST 57*  ALT 29  ALKPHOS 80  BILITOT 0.8  PROT 7.7  ALBUMIN 3.6   Recent Labs  Lab 07/03/21 0052  LIPASE 31   Recent Labs  Lab 07/03/21 0052  AMMONIA 28    ABG    Component Value Date/Time   TCO2 23 07/02/2021 2013     Coagulation Profile: No results for input(s): INR, PROTIME in the last 168 hours.  Cardiac Enzymes: No results for input(s): CKTOTAL, CKMB, CKMBINDEX, TROPONINI in the last 168 hours.  HbA1C: No results found for: HGBA1C  CBG: Recent Labs  Lab 07/03/21 1020  GLUCAP 86    Past Medical History:  He,  has a past medical history of Tobacco abuse.   Surgical History:   Past Surgical History:  Procedure Laterality Date   INTRAMEDULLARY (IM) NAIL INTERTROCHANTERIC Right 03/11/2021   Procedure: INTRAMEDULLARY (IM) NAIL INTERTROCHANTRIC;  Surgeon: Roby Lofts, MD;  Location: [facility] OR;  Service: Orthopedics;  Laterality: Right;   LAPAROSCOPIC GASTROTOMY W/ REPAIR OF ULCER       Social History:   reports that he has been smoking. He has been smoking an average of .5 packs per day. He has never used smokeless tobacco. He reports current alcohol use. He reports previous drug use.   Family History:  His family history is not on file.   Allergies No Known Allergies   Home Medications  Prior to Admission medications   Medication Sig Start Date End Date Taking? Authorizing Provider  acetaminophen (TYLENOL) 500 MG tablet Take 1,000 mg by mouth every 6 (six) hours as needed for mild pain.   Yes [provider]  ferrous sulfate 325 (65 FE) MG tablet TAKE 1 TABLET (325 MG TOTAL) BY MOUTH DAILY. 03/16/21 03/16/22 Yes Simmons-Robinson, Makiera, MD  HYDROcodone-acetaminophen (NORCO/VICODIN) 5-325 MG  tablet Take 1-2 tablets by mouth every 4 (four) hours as needed for moderate pain (pain score 4-6). 03/16/21  Yes Simmons-Robinson, Makiera, MD  methocarbamol (ROBAXIN) 500 MG tablet Take 500 mg by mouth every 6 (six) hours as needed for muscle spasms. 06/26/21  Yes [provider]  apixaban (ELIQUIS) 2.5 MG TABS tablet Take 1 tablet (2.5 mg total) by mouth 2 (two) times daily. Patient not taking: Reported on 07/02/2021 03/16/21   Simmons-Robinson, Tawanna Cooler, MD  apixaban (ELIQUIS) 2.5 MG TABS tablet TAKE 1 TABLET (2.5 MG  TOTAL) BY MOUTH TWO TIMES DAILY. Patient not taking: Reported on 07/02/2021 03/16/21 03/16/22  Simmons-Robinson, Tawanna Cooler, MD  cholecalciferol (VITAMIN D) 25 MCG tablet Take 2 tablets (2,000 Units total) by mouth daily. Patient not taking: Reported on 07/02/2021 03/17/21   Simmons-Robinson, Tawanna Cooler, MD  ferrous sulfate 325 (65 FE) MG tablet Take 1 tablet (325 mg total) by mouth daily. Patient not taking: Reported on 07/02/2021 03/16/21 07/02/21  Simmons-Robinson, Tawanna Cooler, MD  HYDROcodone-acetaminophen (NORCO/VICODIN) 5-325 MG tablet TAKE 1-2 TABLETS BY MOUTH EVERY FOUR HOURS AS NEEDED FOR MODERATE PAIN (PAIN SCORE 4-6). Patient not taking: Reported on 07/02/2021 03/16/21 09/12/21  Simmons-Robinson, Tawanna Cooler, MD  polyethylene glycol (MIRALAX / GLYCOLAX) 17 g packet Take 17 g by mouth daily as needed for mild constipation. Patient not taking: Reported on 07/02/2021 03/16/21   Simmons-Robinson, Tawanna Cooler, MD  polyethylene glycol powder (GLYCOLAX/MIRALAX) 17 GM/SCOOP powder DISSOLVE 1 CAPFUL (17 G) IN WATER AND DRINK DAILY AS NEEDED FOR MILD CONSTIPATION. Patient not taking: Reported on 07/02/2021 03/16/21 03/16/22  Simmons-Robinson, Tawanna Cooler, MD  Vitamin D, Ergocalciferol, (DRISDOL) 1.25 MG (50000 UNIT) CAPS capsule TAKE 1 CAPSULE (50,000 UNITS TOTAL) BY MOUTH EVERY TUESDAY. Patient not taking: Reported on 07/02/2021 03/16/21 03/16/22  Simmons-Robinson, Tawanna Cooler, MD  Vitamin D3 (VITAMIN D) 25 MCG tablet TAKE 2  TABLETS (2,000 UNITS TOTAL) BY MOUTH DAILY. Patient not taking: Reported on 07/02/2021 03/16/21 03/16/22  Simmons-Robinson, Tawanna Cooler, MD      Maury Dus, MD PGY-2 [facility]

## 2021-07-04 NOTE — Progress Notes (Addendum)
PCCM note  Na 123 Will stop normal saline and urea tabs. Continue water restriction  Chilton Greathouse MD [facility] 07/04/2021, 2:31 PM

## 2021-07-04 NOTE — Progress Notes (Addendum)
Initial Nutrition Assessment  DOCUMENTATION CODES:   Severe malnutrition in context of social or environmental circumstances  INTERVENTION:   Boost Breeze po TID, each supplement provides 250 kcal and 9 grams of protein. HS snack daily. MVI with minerals daily. RN to obtain actual weight.  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  GOAL:   Patient will meet greater than or equal to 90% of their needs  MONITOR:   PO intake, Supplement acceptance, Labs  REASON FOR ASSESSMENT:   Rounds    ASSESSMENT:   55 yo male admitted with weakness, leg pain after a mechanical fall, hyponatremia. PMH includes right hip fracture repair March 2022, ETOH use, tobacco use.  Spoke with patient at bedside. He says he has been eating good recently. He did not elaborate when asked specific questions about intake at home. Usual weight 150 lbs. States he has always been small. He thinks he has lost weight d/t decrease in swelling. He does not like the taste of Ensure, but willing to drink Boost Breeze. Per chart review, patient's sister reported that patient drinks beer and water to keep himself full.  Weight history reviewed. Patient weighed 79.4 kg 03/10/21. Current weight documented at 80 kg. Question accuracy of this weight. Recommend obtain actual weight. Discussed with RN.   Labs reviewed. Na 119  Medications reviewed and include urea.  Patient is severely malnourished, likely related to ETOH use and poor intake at home.   NUTRITION - FOCUSED PHYSICAL EXAM:  Flowsheet Row Most Recent Value  Orbital Region Severe depletion  Upper Arm Region Severe depletion  Thoracic and Lumbar Region Severe depletion  Buccal Region Severe depletion  Temple Region Severe depletion  Clavicle Bone Region Severe depletion  Clavicle and Acromion Bone Region Severe depletion   Scapular Bone Region Severe depletion  Dorsal Hand Moderate depletion  Patellar Region Moderate depletion  Anterior Thigh Region Moderate depletion  Posterior Calf Region Moderate depletion  Edema (RD Assessment) Mild  Hair Reviewed  Eyes Reviewed  Mouth Reviewed  Skin Reviewed  Nails Reviewed       Diet Order:   Diet Order             Diet regular Room service appropriate? Yes with Assist; Fluid consistency: Thin; Fluid restriction: Other (see comments)  Diet effective now                   EDUCATION NEEDS:   Education needs have been addressed  Skin:  Skin Assessment: Skin Integrity Issues: Skin Integrity Issues:: Stage I Stage I: sacrum  Last BM:  7/19  Height:   Ht Readings from Last 1 Encounters:  07/02/21 6\' 2"  (1.88 m)    Weight:   Wt Readings from Last 1 Encounters:  07/02/21 80 kg    Ideal Body Weight:  86.4 kg  BMI:  Body mass index is 22.64 kg/m.  Estimated Nutritional Needs:   Kcal:  2200-2400  Protein:  100-120 gm  Fluid:  2-2.2 L    07/04/21, RD, LDN, CNSC Please refer to Amion for contact information.

## 2021-07-05 LAB — BASIC METABOLIC PANEL
Anion gap: 10 (ref 5–15)
BUN: 12 mg/dL (ref 6–20)
CO2: 18 mmol/L — ABNORMAL LOW (ref 22–32)
Calcium: 8.9 mg/dL (ref 8.9–10.3)
Chloride: 96 mmol/L — ABNORMAL LOW (ref 98–111)
Creatinine, Ser: 0.65 mg/dL (ref 0.61–1.24)
GFR, Estimated: >60 mL/min (ref >60)
Glucose, Bld: 161 mg/dL — ABNORMAL HIGH (ref 70–99)
Potassium: 3.5 mmol/L (ref 3.5–5.1)
Sodium: 124 mmol/L — ABNORMAL LOW (ref 135–145)

## 2021-07-05 LAB — URINE CULTURE: Culture: <10000 — AB

## 2021-07-05 LAB — SODIUM
Sodium: 126 mmol/L — ABNORMAL LOW (ref 135–145)
Sodium: 126 mmol/L — ABNORMAL LOW (ref 135–145)

## 2021-07-05 MED ORDER — CEFDINIR 300 MG PO CAPS
300.0000 mg | ORAL_CAPSULE | Freq: Two times a day (BID) | ORAL | Status: DC
  Administered 2021-07-05 – 2021-07-07 (×4): 300 mg via ORAL
  Filled 2021-07-05 (×4): qty 1

## 2021-07-05 MED ORDER — HYDROCODONE-ACETAMINOPHEN 5-325 MG PO TABS: 1.0000 | ORAL_TABLET | Freq: Three times a day (TID) | ORAL | Status: DC | PRN

## 2021-07-05 MED ORDER — UREA 15 G PO PACK
15.0000 g | PACK | Freq: Two times a day (BID) | ORAL | Status: DC
  Administered 2021-07-05 – 2021-07-07 (×5): 15 g via ORAL
  Filled 2021-07-05 (×5): qty 1

## 2021-07-05 MED ORDER — ACETAMINOPHEN 325 MG PO TABS
650.0000 mg | ORAL_TABLET | Freq: Four times a day (QID) | ORAL | Status: DC | PRN
  Administered 2021-07-05 – 2021-07-06 (×4): 650 mg via ORAL
  Filled 2021-07-05 (×4): qty 2

## 2021-07-05 NOTE — Progress Notes (Signed)
PROGRESS NOTE    Martin SowJohn R Williams  WUJ:[id number] DOB: 01/12/1966 DOA: 07/02/2021 PCP: Patient, No Pcp Per (Inactive)   Brief Narrative:  This is a 55 year old African American male who presented to [facility] Emergency Department with complaints of RLE pain and swelling and was found to be hyponatremic to 109. Reports he had nothing to eat in the day prior to admission and vomited 3 times. Drinks 3-4 glasses of water daily and a 40oz beer every other day. In the ED, workup included CT head, CXR, and DVT ultrasound, all of which were normal. He was deemed to be tremulous, so PCCM asked to admit for symptomatic, acute, severe hyponatremia. He was briefly started on hypertonic saline in the ED, which was then stopped on admission.  Assessment & Plan:   Principal Problem:   Hyponatremia Active Problems:   Hypertension   UTI (urinary tract infection)   Delirium tremens (HCC)   Primary polydipsia (HCC)   Pressure injury of skin   Protein-calorie malnutrition, severe  Acute on chronic hyponatremia: Seems to be combination of beer Potomania as well as SIADH based off of the labs available.  Sodium improved to 125 but down to 124 today.  We will continue fluid restriction and will restart on urea tablets and monitor sodium every 8 hours.  UTI: Continue Rocephin.  Culture was unfortunately sent after several days of antibiotics.  This will be unreliable.  Essential hypertension:Controlled.  Not on any scheduled antihypertensives.  Alcohol dependence/abuse: No signs of withdrawal.  Monitor closely.  Nicotine dependence: Nicotine patch.  DVT prophylaxis: heparin injection 5,000 Units Start: 07/02/21 2330 SCDs Start: 07/02/21 2316   Code Status: Full Code  Family Communication: None present at bedside.  Plan of care discussed with patient in length and he verbalized understanding and agreed with it.  Status is: Inpatient  Remains inpatient appropriate because:Persistent severe electrolyte  disturbances  Dispo: The patient is from: Home              Anticipated d/c is to: Home              Patient currently is not medically stable to d/c.   Difficult to place patient No        Estimated body mass index is 15.37 kg/m as calculated from the following:   Height as of this encounter: 6\' 2"  (1.88 m).   Weight as of this encounter: 54.3 kg.  Pressure Injury 07/03/21 Sacrum Mid Stage 1 -  Intact skin with non-blanchable redness of a localized area usually over a bony prominence. (Active)  07/03/21 1030  Location: Sacrum  Location Orientation: Mid  Staging: Stage 1 -  Intact skin with non-blanchable redness of a localized area usually over a bony prominence.  Wound Description (Comments):   Present on Admission: Yes     Nutritional status:  Nutrition Problem: Severe Malnutrition Etiology: social / environmental circumstances (ETOH abuse)   Signs/Symptoms: severe muscle depletion, severe fat depletion   Interventions: Boost Breeze, MVI, Snacks    Consultants:  None  Procedures:  None  Antimicrobials:  Anti-infectives (From admission, onward)    Start     Dose/Rate Route Frequency Ordered Stop   07/02/21 2215  cefTRIAXone (ROCEPHIN) 1 g in sodium chloride 0.9 % 100 mL IVPB        1 g 200 mL/hr over 30 Minutes Intravenous Every 24 hours 07/02/21 2200            Subjective: Patient seen and examined.  Fully alert  and oriented and has no complaints.  Wanted to go home.  In fact was also considering signing AMA but then after counseling, willing to stay overnight.  He really hopes that he will go home tomorrow.  He was made aware that as long as his sodium improves to at least 130, he shall be cleared for discharge tomorrow.  Objective: Vitals:   07/05/21 1000 07/05/21 1100 07/05/21 1134 07/05/21 1200  BP: 90/68   112/72  Pulse: 79 81  82  Resp: 14 12  13   Temp:   98.7 F (37.1 C)   TempSrc:   Oral   SpO2: 100% 100%  100%  Weight:      Height:         Intake/Output Summary (Last 24 hours) at 07/05/2021 1255 Last data filed at 07/05/2021 1043 Gross per 24 hour  Intake 2004.05 ml  Output 2490 ml  Net -485.95 ml   Filed Weights   07/02/21 1512 07/04/21 1100 07/05/21 0500  Weight: 80 kg 54.2 kg 54.3 kg    Examination:  General exam: Appears calm and comfortable  Respiratory system: Clear to auscultation. Respiratory effort normal. Cardiovascular system: S1 & S2 heard, RRR. No JVD, murmurs, rubs, gallops or clicks. No pedal edema. Gastrointestinal system: Abdomen is nondistended, soft and nontender. No organomegaly or masses felt. Normal bowel sounds heard. Central nervous system: Alert and oriented. No focal neurological deficits. Extremities: Symmetric 5 x 5 power. Skin: No rashes, lesions or ulcers.  Psychiatry: Judgement and insight appear normal. Mood & affect appropriate.   Data Reviewed: I have personally reviewed following labs and imaging studies  CBC: Recent Labs  Lab 07/02/21 1551 07/02/21 2006 07/02/21 2013 07/03/21 0500  WBC 5.3 4.6  --  2.9*  NEUTROABS 3.2 2.7  --   --   HGB 11.3* 10.6* 13.3 10.4*  HCT 32.9* 30.3* 39.0 31.4*  MCV 66.9* 65.6*  --  68.6*  PLT 280 266  --  256   Basic Metabolic Panel: Recent Labs  Lab 07/03/21 0500 07/03/21 0735 07/04/21 0311 07/04/21 0437 07/04/21 0855 07/04/21 1229 07/05/21 0136  NA 113*   < > 118* 118* 119* 123* 124*  K 4.3   < > 3.5 5.4* 3.5 4.0 3.5  CL 79*   < > 89* 90* 94* 92* 96*  CO2 21*   < > 21* 18* 20* 23 18*  GLUCOSE 72   < > 85 77 132* 95 161*  BUN <5*   < > 14 13 8 19 12   CREATININE 0.50*   < > 0.52* 0.56* 0.49* 0.53* 0.65  CALCIUM 9.1   < > 8.9 8.8* 8.7* 9.3 8.9  MG 1.6*  --   --   --   --   --   --   PHOS 3.2  --   --   --   --   --   --    < > = values in this interval not displayed.   GFR: Estimated Creatinine Clearance: 79.2 mL/min (by C-G formula based on SCr of 0.65 mg/dL). Liver Function Tests: Recent Labs  Lab 07/02/21 1933   AST 57*  ALT 29  ALKPHOS 80  BILITOT 0.8  PROT 7.7  ALBUMIN 3.6   Recent Labs  Lab 07/03/21 0052  LIPASE 31   Recent Labs  Lab 07/03/21 0052  AMMONIA 28   Coagulation Profile: No results for input(s): INR, PROTIME in the last 168 hours. Cardiac Enzymes: No results for input(s): CKTOTAL, CKMB,  CKMBINDEX, TROPONINI in the last 168 hours. BNP (last 3 results) No results for input(s): PROBNP in the last 8760 hours. HbA1C: No results for input(s): HGBA1C in the last 72 hours. CBG: Recent Labs  Lab 07/03/21 1020  GLUCAP 86   Lipid Profile: Recent Labs    07/03/21 0500  CHOL 145  HDL 90  LDLCALC 46  TRIG 47  CHOLHDL 1.6   Thyroid Function Tests: No results for input(s): TSH, T4TOTAL, FREET4, T3FREE, THYROIDAB in the last 72 hours. Anemia Panel: No results for input(s): VITAMINB12, FOLATE, FERRITIN, TIBC, IRON, RETICCTPCT in the last 72 hours. Sepsis Labs: No results for input(s): PROCALCITON, LATICACIDVEN in the last 168 hours.  Recent Results (from the past 240 hour(s))  Resp Panel by RT-PCR (Flu A&B, Covid) Nasopharyngeal Swab     Status: None   Collection Time: 07/02/21  8:09 PM   Specimen: Nasopharyngeal Swab; Nasopharyngeal(NP) swabs in vial transport medium  Result Value Ref Range Status   SARS Coronavirus 2 by RT PCR NEGATIVE NEGATIVE Final    Comment: (NOTE) SARS-CoV-2 target nucleic acids are NOT DETECTED.  The SARS-CoV-2 RNA is generally detectable in upper respiratory specimens during the acute phase of infection. The lowest concentration of SARS-CoV-2 viral copies this assay can detect is 138 copies/mL. A negative result does not preclude SARS-Cov-2 infection and should not be used as the sole basis for treatment or other patient management decisions. A negative result may occur with  improper specimen collection/handling, submission of specimen other than nasopharyngeal swab, presence of viral mutation(s) within the areas targeted by this  assay, and inadequate number of viral copies(<138 copies/mL). A negative result must be combined with clinical observations, patient history, and epidemiological information. The expected result is Negative.  Fact Sheet for Patients:  BloggerCourse.com  Fact Sheet for Healthcare Providers:  SeriousBroker.it  This test is no t yet approved or cleared by the [place] FDA and  has been authorized for detection and/or diagnosis of SARS-CoV-2 by FDA under an Emergency Use Authorization (EUA). This EUA will remain  in effect (meaning this test can be used) for the duration of the COVID-19 declaration under Section 564(b)(1) of the Act, 21 U.S.C.section 360bbb-3(b)(1), unless the authorization is terminated  or revoked sooner.       Influenza A by PCR NEGATIVE NEGATIVE Final   Influenza B by PCR NEGATIVE NEGATIVE Final    Comment: (NOTE) The Xpert Xpress SARS-CoV-2/FLU/RSV plus assay is intended as an aid in the diagnosis of influenza from Nasopharyngeal swab specimens and should not be used as a sole basis for treatment. Nasal washings and aspirates are unacceptable for Xpert Xpress SARS-CoV-2/FLU/RSV testing.  Fact Sheet for Patients: BloggerCourse.com  Fact Sheet for Healthcare Providers: SeriousBroker.it  This test is not yet approved or cleared by the [place] FDA and has been authorized for detection and/or diagnosis of SARS-CoV-2 by FDA under an Emergency Use Authorization (EUA). This EUA will remain in effect (meaning this test can be used) for the duration of the COVID-19 declaration under Section 564(b)(1) of the Act, 21 U.S.C. section 360bbb-3(b)(1), unless the authorization is terminated or revoked.  Performed at [facility], [address], [place], [place] [address]   MRSA Next Gen by PCR, Nasal     Status: None   Collection Time: 07/03/21 10:22 AM    Specimen: Nasal Mucosa; Nasal Swab  Result Value Ref Range Status   MRSA by PCR Next Gen NOT DETECTED NOT DETECTED Final  Comment: (NOTE) The GeneXpert MRSA Assay (FDA approved for NASAL specimens only), is one component of a comprehensive MRSA colonization surveillance program. It is not intended to diagnose MRSA infection nor to guide or monitor treatment for MRSA infections. Test performance is not FDA approved in patients less than 79 years old. Performed at [facility], [address] [place], [place] [address]   Urine Culture     Status: Abnormal   Collection Time: 07/04/21 12:30 PM   Specimen: Urine, Clean Catch  Result Value Ref Range Status   Specimen Description URINE, CLEAN CATCH  Final   Special Requests NONE  Final   Culture (A)  Final    <10,000 COLONIES/mL INSIGNIFICANT GROWTH Performed at [facility], [address], [place], [place] [address]    Report Status 07/05/2021 FINAL  Final      Radiology Studies: No results found.  Scheduled Meds:  Chlorhexidine Gluconate Cloth  6 each Topical Daily   feeding supplement  1 Container Oral TID BM   heparin  5,000 Units Subcutaneous Q8H   multivitamin with minerals  1 tablet Oral Daily   nicotine  14 mg Transdermal Daily   Continuous Infusions:  cefTRIAXone (ROCEPHIN)  IV Stopped (07/04/21 2217)     LOS: 3 days   Time spent: 35 minutes   Hughie Closs, MD [facility]  07/05/2021, 12:55 PM   How to contact the [facility] Attending or Consulting provider 7A - 7P or covering provider during after hours 7P -7A, for this patient?  Check the care team in [facility] and look for a) attending/consulting TRH provider listed and b) the [facility] team listed. Page or secure chat 7A-7P. Log into [email or web address] and use [employer]'s universal password to access. If you do not have the password, please contact the hospital operator. Locate the [facility] provider you are looking for under [facility] and page to a  number that you can be directly reached. If you still have difficulty reaching the provider, please page the [facility] (Director on Call) for the Hospitalists listed on amion for assistance.

## 2021-07-06 LAB — BASIC METABOLIC PANEL
Anion gap: 9 (ref 5–15)
BUN: 15 mg/dL (ref 6–20)
CO2: 22 mmol/L (ref 22–32)
Calcium: 9.2 mg/dL (ref 8.9–10.3)
Chloride: 95 mmol/L — ABNORMAL LOW (ref 98–111)
Creatinine, Ser: 0.53 mg/dL — ABNORMAL LOW (ref 0.61–1.24)
GFR, Estimated: >60 mL/min (ref >60)
Glucose, Bld: 103 mg/dL — ABNORMAL HIGH (ref 70–99)
Potassium: 3.7 mmol/L (ref 3.5–5.1)
Sodium: 126 mmol/L — ABNORMAL LOW (ref 135–145)

## 2021-07-06 LAB — SODIUM: Sodium: 128 mmol/L — ABNORMAL LOW (ref 135–145)

## 2021-07-06 MED ORDER — SODIUM BICARBONATE 650 MG PO TABS
650.0000 mg | ORAL_TABLET | Freq: Two times a day (BID) | ORAL | Status: DC
  Administered 2021-07-06 – 2021-07-07 (×3): 650 mg via ORAL
  Filled 2021-07-06 (×3): qty 1

## 2021-07-06 NOTE — Evaluation (Signed)
Occupational Therapy Evaluation Patient Details Name: Martin Williams MRN: [id number] DOB: [birth year] Today's Date: 07/06/2021    History of Present Illness This 55 y.o. African American male smoker presented to the [facility] Emergency Department via EMS with complaints of R LE swelling and pain after a mechanical fall (wet floor); no loss of consciousness. CT of head negative. Found to be hyponatremic, UTI. PHMx: ETOH, right IM Nail 02/2021   Clinical Impression   This 55 yo male admitted with above presents to acute OT at a min guard A (when up on feet without AD and not shoes)-S level for basic ADLs and pt has 24/7 S/A at home per his report. PT to try shoes and SPC to see what a difference this may make. No further OT needs, we will sign off.    Follow Up Recommendations  No OT follow up;Supervision - Intermittent    Equipment Recommendations  None recommended by OT       Precautions / Restrictions Precautions Precautions: Fall Restrictions Weight Bearing Restrictions: No      Mobility Bed Mobility               General bed mobility comments: Pt up in chair    Transfers Overall transfer level: Needs assistance Equipment used: None Transfers: Sit to/from Stand Sit to Stand: Min guard              Balance Overall balance assessment: Mild deficits observed, not formally tested                                         ADL either performed or assessed with clinical judgement   ADL                                         General ADL Comments: Overall at a min guard (up on feet without AD) to S level. Pt reports he does better with SPC and his shoes on (PT to try this with him)     Vision Patient Visual Report: No change from baseline              Pertinent Vitals/Pain Pain Assessment: No/denies pain     Hand Dominance Right   Extremity/Trunk Assessment Upper Extremity Assessment Upper Extremity  Assessment: Overall WFL for tasks assessed      Communication Communication Communication: No difficulties   Cognition Arousal/Alertness: Awake/alert Behavior During Therapy: WFL for tasks assessed/performed Overall Cognitive Status: Within Functional Limits for tasks assessed                                                      Home Living Family/patient expects to be discharged to:: Private residence Living Arrangements: Other relatives Available Help at Discharge: Family;Available 24 hours/day Type of Home: Apartment Home Access: Level entry     Home Layout: One level     Bathroom Shower/Tub: Tub/shower unit;Curtain   Bathroom Toilet: Handicapped height     Home Equipment: Environmental consultant - 2 wheels;Cane - single point;Bedside commode;Shower seat          Prior Functioning/Environment Level of Independence: Independent  Comments: Does not drive        OT Problem List: Impaired balance (sitting and/or standing)            OT Goals(Current goals can be found in the care plan section) Acute Rehab OT Goals Patient Stated Goal: to go home tomorrow                                 AM-PAC OT "6 Clicks" Daily Activity     Outcome Measure Help from another person eating meals?: None Help from another person taking care of personal grooming?: A Little Help from another person toileting, which includes using toliet, bedpan, or urinal?: A Little Help from another person bathing (including washing, rinsing, drying)?: A Little Help from another person to put on and taking off regular upper body clothing?: A Little Help from another person to put on and taking off regular lower body clothing?: A Little 6 Click Score: 19   End of Session Equipment Utilized During Treatment: Gait belt  Activity Tolerance: Patient tolerated treatment well Patient left: in chair;with call bell/phone within reach;with chair alarm set  OT Visit  Diagnosis: Unsteadiness on feet (R26.81)                Time: 3546-5681 OT Time Calculation (min): 19 min Charges:  OT General Charges $OT Visit: 1 Visit OT Evaluation $OT Eval Moderate Complexity: 1 Mod  Martin Williams, OTR/L Acute [employer] Pager [phone] Office [phone]    Martin Williams 07/06/2021, 11:06 AM

## 2021-07-06 NOTE — Evaluation (Signed)
Physical Therapy Evaluation Patient Details Name: Martin Williams MRN: [id number] DOB: 12/09/1966 Today's Date: 07/06/2021   History of Present Illness  This 55 y.o. African American male smoker presented to the [facility] Emergency Department via EMS with complaints of R LE swelling and pain after a mechanical fall (wet floor); no loss of consciousness. CT of head negative. Found to be hyponatremic, UTI. PHMx: ETOH, right IM Nail 02/2021  Clinical Impression  Pt doing well with mobility and no further PT needed.  Pt amb in shoes and used a straight cane which improved his gait compared to when seen earlier with OT. Pt eager to return home.      Follow Up Recommendations No PT follow up    Equipment Recommendations  None recommended by PT    Recommendations for Other Services       Precautions / Restrictions Precautions Precautions: Fall      Mobility  Bed Mobility               General bed mobility comments: Pt up in chair    Transfers Overall transfer level: Modified independent Equipment used: Straight cane Transfers: Sit to/from Stand Sit to Stand: Modified independent (Device/Increase time)            Ambulation/Gait Ambulation/Gait assistance: Modified independent (Device/Increase time) Gait Distance (Feet): 160 Feet Assistive device: Straight cane Gait Pattern/deviations: Step-through pattern Gait velocity: normal Gait velocity interpretation: >4.37 ft/sec, indicative of normal walking speed General Gait Details: Steady gait using cane and wearing shoes. Able to avoid unexpected obstacles and perform quick turns  Information systems manager [place] (Stroke Patients Only)       Balance Overall balance assessment: Mild deficits observed, not formally tested                                           Pertinent Vitals/Pain Pain Assessment: No/denies pain    Home Living  Family/patient expects to be discharged to:: Private residence Living Arrangements: Other relatives Available Help at Discharge: Family;Available 24 hours/day Type of Home: Apartment Home Access: Level entry     Home Layout: One level Home Equipment: Walker - 2 wheels;Cane - single point;Bedside commode;Shower seat      Prior Function Level of Independence: Independent         Comments: Does not drive     Hand Dominance   Dominant Hand: Right    Extremity/Trunk Assessment   Upper Extremity Assessment Upper Extremity Assessment: Overall WFL for tasks assessed    Lower Extremity Assessment Lower Extremity Assessment: Overall WFL for tasks assessed       Communication   Communication: No difficulties  Cognition Arousal/Alertness: Awake/alert Behavior During Therapy: WFL for tasks assessed/performed Overall Cognitive Status: Within Functional Limits for tasks assessed                                        General Comments      Exercises     Assessment/Plan    PT Assessment Patent does not need any further PT services  PT Problem List         PT Treatment Interventions      PT Goals (Current goals  can be found in the Care Plan section)  Acute Rehab PT Goals Patient Stated Goal: to go home tomorrow PT Goal Formulation: All assessment and education complete, DC therapy    Frequency     Barriers to discharge        Co-evaluation               AM-PAC PT "6 Clicks" Mobility  Outcome Measure Help needed turning from your back to your side while in a flat bed without using bedrails?: None Help needed moving from lying on your back to sitting on the side of a flat bed without using bedrails?: None Help needed moving to and from a bed to a chair (including a wheelchair)?: None Help needed standing up from a chair using your arms (e.g., wheelchair or bedside chair)?: None Help needed to walk in hospital room?: None Help needed  climbing 3-5 steps with a railing? : None 6 Click Score: 24    End of Session   Activity Tolerance: Patient tolerated treatment well Patient left: in chair;with call bell/phone within reach;with chair alarm set   PT Visit Diagnosis: Other abnormalities of gait and mobility (R26.89)    Time: 1005-1018 PT Time Calculation (min) (ACUTE ONLY): 13 min   Charges:   PT Evaluation $PT Eval Low Complexity: 1 Low          [facility] PT Acute Rehabilitation Services Pager [phone] Office [phone]   Martin Williams 07/06/2021, 1:10 PM

## 2021-07-06 NOTE — Progress Notes (Signed)
PROGRESS NOTE    Martin Williams  ZMO:[id number] DOB: August 19, 1966 DOA: 07/02/2021 PCP: Patient, No Pcp Per (Inactive)   Brief Narrative:  This is a 55 year old African American male who presented to [facility] Emergency Department with complaints of RLE pain and swelling and was found to be hyponatremic to 109. Reports he had nothing to eat in the day prior to admission and vomited 3 times. Drinks 3-4 glasses of water daily and a 40oz beer every other day. In the ED, workup included CT head, CXR, and DVT ultrasound, all of which were normal. He was deemed to be tremulous, so PCCM asked to admit for symptomatic, acute, severe hyponatremia. He was briefly started on hypertonic saline in the ED, which was then stopped on admission.  Assessment & Plan:   Principal Problem:   Hyponatremia Active Problems:   Hypertension   UTI (urinary tract infection)   Delirium tremens (HCC)   Primary polydipsia (HCC)   Pressure injury of skin   Protein-calorie malnutrition, severe  Acute on chronic hyponatremia: Presented with sodium 111.  Seems to be combination of beer Potomania as well as SIADH based off of the labs available.  While under PCCM, started on some IV fluids, urea tablets.  Improved to 126 now.  Urea tablets were discontinued by PCCM but resumed by me yesterday.  Not much improvement from yesterday and sodium.  We will add sodium tablets.  Goal 130.  UTI: Continue cefdinir.  Culture was unfortunately sent after several days of antibiotics.  This will be unreliable.  Essential hypertension:Controlled.  Not on any scheduled antihypertensives.  Alcohol dependence/abuse: No signs of withdrawal.  Monitor closely.  Nicotine dependence: Nicotine patch.  DVT prophylaxis: heparin injection 5,000 Units Start: 07/02/21 2330 SCDs Start: 07/02/21 2316   Code Status: Full Code  Family Communication: None present at bedside.  Plan of care discussed with patient in length and he verbalized understanding and  agreed with it.  Status is: Inpatient  Remains inpatient appropriate because:Persistent severe electrolyte disturbances  Dispo: The patient is from: Home              Anticipated d/c is to: Home              Patient currently is not medically stable to d/c.   Difficult to place patient No        Estimated body mass index is 15.79 kg/m as calculated from the following:   Height as of this encounter: 6\' 2"  (1.88 m).   Weight as of this encounter: 55.8 kg.  Pressure Injury 07/03/21 Sacrum Mid Stage 1 -  Intact skin with non-blanchable redness of a localized area usually over a bony prominence. (Active)  07/03/21 1030  Location: Sacrum  Location Orientation: Mid  Staging: Stage 1 -  Intact skin with non-blanchable redness of a localized area usually over a bony prominence.  Wound Description (Comments):   Present on Admission: Yes     Nutritional status:  Nutrition Problem: Severe Malnutrition Etiology: social / environmental circumstances (ETOH abuse)   Signs/Symptoms: severe muscle depletion, severe fat depletion   Interventions: Boost Breeze, MVI, Snacks    Consultants:  None  Procedures:  None  Antimicrobials:  Anti-infectives (From admission, onward)    Start     Dose/Rate Route Frequency Ordered Stop   07/05/21 2200  cefdinir (OMNICEF) capsule 300 mg        300 mg Oral Every 12 hours 07/05/21 1306 07/09/21 2359   07/02/21 2215  cefTRIAXone (ROCEPHIN)  1 g in sodium chloride 0.9 % 100 mL IVPB  Status:  Discontinued        1 g 200 mL/hr over 30 Minutes Intravenous Every 24 hours 07/02/21 2200 07/05/21 1305          Subjective: Seen and examined.  No complaints.  Disappointed that his sodium is not 130 as he hoped for and wanted to go home.  However he is agreeable to stay overnight again.  Objective: Vitals:   07/06/21 0600 07/06/21 0700 07/06/21 0735 07/06/21 0800  BP:    (!) 117/96  Pulse: 84 90  80  Resp:    13  Temp:   98 F (36.7 C)    TempSrc:   Oral   SpO2:    97%  Weight:      Height:        Intake/Output Summary (Last 24 hours) at 07/06/2021 1117 Last data filed at 07/06/2021 0800 Gross per 24 hour  Intake 1777 ml  Output 2105 ml  Net -328 ml    Filed Weights   07/04/21 1100 07/05/21 0500 07/06/21 0500  Weight: 54.2 kg 54.3 kg 55.8 kg    Examination:  General exam: Appears calm and comfortable  Respiratory system: Clear to auscultation. Respiratory effort normal. Cardiovascular system: S1 & S2 heard, RRR. No JVD, murmurs, rubs, gallops or clicks. No pedal edema. Gastrointestinal system: Abdomen is nondistended, soft and nontender. No organomegaly or masses felt. Normal bowel sounds heard. Central nervous system: Alert and oriented. No focal neurological deficits. Extremities: Symmetric 5 x 5 power. Skin: No rashes, lesions or ulcers.  Psychiatry: Judgement and insight appear normal. Mood & affect appropriate.    Data Reviewed: I have personally reviewed following labs and imaging studies  CBC: Recent Labs  Lab 07/02/21 1551 07/02/21 2006 07/02/21 2013 07/03/21 0500  WBC 5.3 4.6  --  2.9*  NEUTROABS 3.2 2.7  --   --   HGB 11.3* 10.6* 13.3 10.4*  HCT 32.9* 30.3* 39.0 31.4*  MCV 66.9* 65.6*  --  68.6*  PLT 280 266  --  256    Basic Metabolic Panel: Recent Labs  Lab 07/03/21 0500 07/03/21 0735 07/04/21 0437 07/04/21 0855 07/04/21 1229 07/05/21 0136 07/05/21 1311 07/05/21 2112 07/06/21 0653  NA 113*   < > 118* 119* 123* 124* 126* 126* 126*  K 4.3   < > 5.4* 3.5 4.0 3.5  --   --  3.7  CL 79*   < > 90* 94* 92* 96*  --   --  95*  CO2 21*   < > 18* 20* 23 18*  --   --  22  GLUCOSE 72   < > 77 132* 95 161*  --   --  103*  BUN <5*   < > 13 8 19 12   --   --  15  CREATININE 0.50*   < > 0.56* 0.49* 0.53* 0.65  --   --  0.53*  CALCIUM 9.1   < > 8.8* 8.7* 9.3 8.9  --   --  9.2  MG 1.6*  --   --   --   --   --   --   --   --   PHOS 3.2  --   --   --   --   --   --   --   --    < > = values  in this interval not displayed.    GFR: Estimated Creatinine Clearance:  81.4 mL/min (A) (by C-G formula based on SCr of 0.53 mg/dL (L)). Liver Function Tests: Recent Labs  Lab 07/02/21 1933  AST 57*  ALT 29  ALKPHOS 80  BILITOT 0.8  PROT 7.7  ALBUMIN 3.6    Recent Labs  Lab 07/03/21 0052  LIPASE 31    Recent Labs  Lab 07/03/21 0052  AMMONIA 28    Coagulation Profile: No results for input(s): INR, PROTIME in the last 168 hours. Cardiac Enzymes: No results for input(s): CKTOTAL, CKMB, CKMBINDEX, TROPONINI in the last 168 hours. BNP (last 3 results) No results for input(s): PROBNP in the last 8760 hours. HbA1C: No results for input(s): HGBA1C in the last 72 hours. CBG: Recent Labs  Lab 07/03/21 1020  GLUCAP 86    Lipid Profile: No results for input(s): CHOL, HDL, LDLCALC, TRIG, CHOLHDL, LDLDIRECT in the last 72 hours.  Thyroid Function Tests: No results for input(s): TSH, T4TOTAL, FREET4, T3FREE, THYROIDAB in the last 72 hours. Anemia Panel: No results for input(s): VITAMINB12, FOLATE, FERRITIN, TIBC, IRON, RETICCTPCT in the last 72 hours. Sepsis Labs: No results for input(s): PROCALCITON, LATICACIDVEN in the last 168 hours.  Recent Results (from the past 240 hour(s))  Resp Panel by RT-PCR (Flu A&B, Covid) Nasopharyngeal Swab     Status: None   Collection Time: 07/02/21  8:09 PM   Specimen: Nasopharyngeal Swab; Nasopharyngeal(NP) swabs in vial transport medium  Result Value Ref Range Status   SARS Coronavirus 2 by RT PCR NEGATIVE NEGATIVE Final    Comment: (NOTE) SARS-CoV-2 target nucleic acids are NOT DETECTED.  The SARS-CoV-2 RNA is generally detectable in upper respiratory specimens during the acute phase of infection. The lowest concentration of SARS-CoV-2 viral copies this assay can detect is 138 copies/mL. A negative result does not preclude SARS-Cov-2 infection and should not be used as the sole basis for treatment or other patient management  decisions. A negative result may occur with  improper specimen collection/handling, submission of specimen other than nasopharyngeal swab, presence of viral mutation(s) within the areas targeted by this assay, and inadequate number of viral copies(<138 copies/mL). A negative result must be combined with clinical observations, patient history, and epidemiological information. The expected result is Negative.  Fact Sheet for Patients:  BloggerCourse.comhttps://www.fda.gov/media/152166/download  Fact Sheet for Healthcare Providers:  SeriousBroker.ithttps://www.fda.gov/media/152162/download  This test is no t yet approved or cleared by the [place] FDA and  has been authorized for detection and/or diagnosis of SARS-CoV-2 by FDA under an Emergency Use Authorization (EUA). This EUA will remain  in effect (meaning this test can be used) for the duration of the COVID-19 declaration under Section 564(b)(1) of the Act, 21 U.S.C.section 360bbb-3(b)(1), unless the authorization is terminated  or revoked sooner.       Influenza A by PCR NEGATIVE NEGATIVE Final   Influenza B by PCR NEGATIVE NEGATIVE Final    Comment: (NOTE) The Xpert Xpress SARS-CoV-2/FLU/RSV plus assay is intended as an aid in the diagnosis of influenza from Nasopharyngeal swab specimens and should not be used as a sole basis for treatment. Nasal washings and aspirates are unacceptable for Xpert Xpress SARS-CoV-2/FLU/RSV testing.  Fact Sheet for Patients: BloggerCourse.comhttps://www.fda.gov/media/152166/download  Fact Sheet for Healthcare Providers: SeriousBroker.ithttps://www.fda.gov/media/152162/download  This test is not yet approved or cleared by the [place] FDA and has been authorized for detection and/or diagnosis of SARS-CoV-2 by FDA under an Emergency Use Authorization (EUA). This EUA will remain in effect (meaning this test can be used) for the duration of the COVID-19 declaration  under Section 564(b)(1) of the Act, 21 U.S.C. section 360bbb-3(b)(1), unless the  authorization is terminated or revoked.  Performed at [facility], [address], [place], [place] [address]   MRSA Next Gen by PCR, Nasal     Status: None   Collection Time: 07/03/21 10:22 AM   Specimen: Nasal Mucosa; Nasal Swab  Result Value Ref Range Status   MRSA by PCR Next Gen NOT DETECTED NOT DETECTED Final    Comment: (NOTE) The GeneXpert MRSA Assay (FDA approved for NASAL specimens only), is one component of a comprehensive MRSA colonization surveillance program. It is not intended to diagnose MRSA infection nor to guide or monitor treatment for MRSA infections. Test performance is not FDA approved in patients less than 41 years old. Performed at [facility], [address], [place], [place] [address]   Urine Culture     Status: Abnormal   Collection Time: 07/04/21 12:30 PM   Specimen: Urine, Clean Catch  Result Value Ref Range Status   Specimen Description URINE, CLEAN CATCH  Final   Special Requests NONE  Final   Culture (A)  Final    <10,000 COLONIES/mL INSIGNIFICANT GROWTH Performed at [facility], [address] [place], [place] [address]    Report Status 07/05/2021 FINAL  Final       Radiology Studies: No results found.  Scheduled Meds:  cefdinir  300 mg Oral Q12H   Chlorhexidine Gluconate Cloth  6 each Topical Daily   feeding supplement  1 Container Oral TID BM   heparin  5,000 Units Subcutaneous Q8H   multivitamin with minerals  1 tablet Oral Daily   nicotine  14 mg Transdermal Daily   sodium bicarbonate  650 mg Oral BID   urea  15 g Oral BID   Continuous Infusions:     LOS: 4 days   Time spent: 28 minutes   Hughie Closs, MD [facility]  07/06/2021, 11:17 AM   How to contact the [facility] Attending or Consulting provider 7A - 7P or covering provider during after hours 7P -7A, for this patient?  Check the care team in [facility] and look for a) attending/consulting [facility] provider listed and b) the [facility] team listed. Page or  secure chat 7A-7P. Log into [email or web address] and use [place]'s universal password to access. If you do not have the password, please contact the hospital operator. Locate the [facility] provider you are looking for under [facility] and page to a number that you can be directly reached. If you still have difficulty reaching the provider, please page the [facility] (Director on Call) for the Hospitalists listed on amion for assistance.

## 2021-07-07 LAB — SODIUM
Sodium: 129 mmol/L — ABNORMAL LOW (ref 135–145)
Sodium: 130 mmol/L — ABNORMAL LOW (ref 135–145)

## 2021-07-07 MED ORDER — CEFDINIR 300 MG PO CAPS: 300.0000 mg | ORAL_CAPSULE | Freq: Two times a day (BID) | ORAL | 0 refills | Status: AC

## 2021-07-07 NOTE — Progress Notes (Signed)
Pt transferred from 6M. Skin assessment completed with Lowella Bandy, RN. Pt has a stage 1 pressure ulcer on sacrum, mepilex applied.

## 2021-07-07 NOTE — Progress Notes (Signed)
DISCHARGE NOTE HOME YUMA PACELLA to be discharged Home per MD order. Discussed prescriptions and follow up appointments with the patient. Prescriptions given to patient; medication list explained in detail. Patient verbalized understanding.  Skin clean, dry and intact without evidence of skin break down, no evidence of skin tears noted. IV catheter discontinued intact. Site without signs and symptoms of complications. Dressing and pressure applied. Pt denies pain at the site currently. No complaints noted.  Patient free of lines, drains, and wounds.   An After Visit Summary (AVS) was printed and given to the patient. Patient escorted via wheelchair, and discharged home via private auto.  [place], Kem Kays, RN

## 2021-07-07 NOTE — Plan of Care (Signed)
  Problem: Education: Goal: Knowledge of General Education information will improve Description: Including pain rating scale, medication(s)/side effects and non-pharmacologic comfort measures Outcome: Adequate for Discharge   Problem: Health Behavior/Discharge Planning: Goal: Ability to manage health-related needs will improve Outcome: Adequate for Discharge   Problem: Clinical Measurements: Goal: Ability to maintain clinical measurements within normal limits will improve Outcome: Adequate for Discharge Goal: Will remain free from infection Outcome: Adequate for Discharge Goal: Diagnostic test results will improve Outcome: Adequate for Discharge Goal: Respiratory complications will improve Outcome: Adequate for Discharge Goal: Cardiovascular complication will be avoided Outcome: Adequate for Discharge   Problem: Clinical Measurements: Goal: Will remain free from infection Outcome: Adequate for Discharge   Problem: Clinical Measurements: Goal: Diagnostic test results will improve Outcome: Adequate for Discharge   Problem: Clinical Measurements: Goal: Respiratory complications will improve Outcome: Adequate for Discharge   Problem: Nutrition: Goal: Adequate nutrition will be maintained Outcome: Adequate for Discharge   Problem: Coping: Goal: Level of anxiety will decrease Outcome: Adequate for Discharge   Problem: Pain Managment: Goal: General experience of comfort will improve Outcome: Adequate for Discharge   Problem: Pain Managment: Goal: General experience of comfort will improve Outcome: Adequate for Discharge   Problem: Safety: Goal: Ability to remain free from injury will improve Outcome: Adequate for Discharge   Problem: Skin Integrity: Goal: Risk for impaired skin integrity will decrease Outcome: Adequate for Discharge

## 2021-07-07 NOTE — Discharge Summary (Addendum)
Physician Discharge Summary  Martin Williams YNW:[id number] DOB: 07/19/1966 DOA: 07/02/2021  PCP: Patient, No Pcp Per (Inactive)  Admit date: 07/02/2021 Discharge date: 07/07/2021 30 Day Unplanned Readmission Risk Score    Flowsheet Row ED to Hosp-Admission (Current) from 07/02/2021 in [facility]  30 Day Unplanned Readmission Risk Score (%) 13.63 Filed at 07/07/2021 0400       This score is the patient's risk of an unplanned readmission within 30 days of being discharged (0 -100%). The score is based on dignosis, age, lab data, medications, orders, and past utilization.   Low:  0-14.9   Medium: 15-21.9   High: 22-29.9   Extreme: 30 and above          Admitted From: Home Disposition: Home  Recommendations for Outpatient Follow-up:  Follow up with PCP in 1-2 weeks Please obtain BMP/CBC in one week Please follow up with your PCP on the following pending results: Unresulted Labs (From admission, onward)    None         Home Health: None Equipment/Devices: None  Discharge Condition: Stable CODE STATUS: Full code Diet recommendation: Cardiac  Subjective: Seen and examined.  No complaints.  Wants to go home.  Brief/Interim Summary: This is a 55 year old African American male who presented to [facility] Emergency Department with complaints of RLE pain and swelling and was found to be hyponatremic to 109. Reports he had nothing to eat in the day prior to admission and vomited 3 times. Drinks 3-4 glasses of water daily and a 40oz beer every other day. In the ED, workup included CT head, CXR, and DVT ultrasound, all of which were normal. He was deemed to be tremulous, so PCCM asked to admit for symptomatic, acute, severe hyponatremia. He was briefly started on hypertonic saline in the ED, which was then stopped on admission.  His sodium improved to 126.  He was transferred to [facility] on 07/05/2021.  He was also given urea.  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  PCCM stopped urea before  transferring to [facility].  With that, his sodium dropped to 124.  Urea and salt tablets were restarted.  Sodium improved to 130 today which is very close to his baseline as he does have chronic hyponatremia and his baseline remains between 1  129-134.  Patient remains asymptomatic.  Seen by PT OT and they think he does not need any further PT OT so he is going to be discharged home in stable condition.  He does not seem to be on any medications which can cause SIADH.  Cause remains unclear.  Of note, patient was also thought to be having UTI for which he was started on Rocephin and subsequently transition to cefdinir.  He will continue cefdinir for couple of days as outpatient.  Urine culture grew insignificant growth.  Patient did not have any alcohol withdrawal during this hospitalization.  He has been counseled in length to avoid alcohol.  Discharge Diagnoses:  Principal Problem:   Hyponatremia Active Problems:   Hypertension   UTI (urinary tract infection)   Delirium tremens (HCC)   Primary polydipsia (HCC)   Pressure injury of skin   Protein-calorie malnutrition, severe    Discharge Instructions   Allergies as of 07/07/2021   No Known Allergies      Medication List     STOP taking these medications    acetaminophen 500 MG tablet Commonly known as: TYLENOL   apixaban 2.5 MG Tabs tablet Commonly known as: ELIQUIS   Eliquis 2.5  MG Tabs tablet Generic drug: apixaban   polyethylene glycol 17 g packet Commonly known as: MIRALAX / GLYCOLAX   polyethylene glycol powder 17 GM/SCOOP powder Commonly known as: GLYCOLAX/MIRALAX   Vitamin D (Ergocalciferol) 1.25 MG (50000 UNIT) Caps capsule Commonly known as: DRISDOL   Vitamin D3 25 MCG tablet Commonly known as: Vitamin D       TAKE these medications    cefdinir 300 MG capsule Commonly known as: OMNICEF Take 1 capsule (300 mg total) by mouth every 12 (twelve) hours for 2 days.   FeroSul 325 (65 FE) MG tablet Generic drug:  ferrous sulfate TAKE 1 TABLET (325 MG TOTAL) BY MOUTH DAILY. What changed: Another medication with the same name was removed. Continue taking this medication, and follow the directions you see here.   HYDROcodone-acetaminophen 5-325 MG tablet Commonly known as: NORCO/VICODIN Take 1-2 tablets by mouth every 4 (four) hours as needed for moderate pain (pain score 4-6). What changed: Another medication with the same name was removed. Continue taking this medication, and follow the directions you see here.   methocarbamol 500 MG tablet Commonly known as: ROBAXIN Take 500 mg by mouth every 6 (six) hours as needed for muscle spasms.        No Known Allergies  Consultations: None   Procedures/Studies: DG Pelvis 1-2 Views  Result Date: 07/02/2021 CLINICAL DATA:  Pelvic pain post fall EXAM: PELVIS - 1-2 VIEW COMPARISON:  03/11/2021 FINDINGS: SI joints are non widened. Pubic symphysis and rami are intact. Interval operative fixation of right intertrochanteric fracture with stable alignment. IMPRESSION: Operative fixation of right femur fracture. No acute osseous abnormality Electronically Signed   By: Jasmine Pang M.D.   On: 07/02/2021 17:04   CT Head Wo Contrast  Result Date: 07/02/2021 CLINICAL DATA:  Seizure. Abnormal neuro exam. Tremors. Hyponatremia. EXAM: CT HEAD WITHOUT CONTRAST TECHNIQUE: Contiguous axial images were obtained from the base of the skull through the vertex without intravenous contrast. COMPARISON:  None. FINDINGS: Brain: Patchy and confluent areas of decreased attenuation are noted throughout the deep and periventricular white matter of the cerebral hemispheres bilaterally, compatible with chronic microvascular ischemic disease. No evidence of large-territorial acute infarction. No parenchymal hemorrhage. No mass lesion. No extra-axial collection. No mass effect or midline shift. No hydrocephalus. Basilar cisterns are patent. Vascular: No hyperdense vessel. Skull: No acute  fracture or focal lesion. Sinuses/Orbits: Paranasal sinuses and mastoid air cells are clear. The orbits are unremarkable. Other: None. IMPRESSION: No acute intracranial abnormality. Electronically Signed   By: Tish Frederickson M.D.   On: 07/02/2021 21:56   DG Chest Port 1 View  Result Date: 07/02/2021 CLINICAL DATA:  Hyponatremia EXAM: PORTABLE CHEST 1 VIEW COMPARISON:  03/10/2021 FINDINGS: The heart size and mediastinal contours are within normal limits. Both lungs are clear. The visualized skeletal structures are unremarkable. IMPRESSION: No active disease. Electronically Signed   By: Helyn Numbers MD   On: 07/02/2021 22:42   DG Femur Min 2 Views Right  Result Date: 07/02/2021 CLINICAL DATA:  Right leg and pelvic pain post fall surgery in March EXAM: RIGHT FEMUR 2 VIEWS COMPARISON:  03/11/2021, 03/10/2021 FINDINGS: Status post intramedullary rodding and distal screw fixation of right intertrochanteric fracture with stable appearance of hardware and some interval callus formation. Mild periosteal new bone formation at the distal screw tips. No acute fracture is seen IMPRESSION: Status post intramedullary rodding of right intertrochanteric fracture with grossly stable alignment and some interval callus. No definite acute osseous abnormality Electronically Signed  By: Jasmine Pang M.D.   On: 07/02/2021 17:03   VAS [place] LOWER EXTREMITY VENOUS (DVT) (ONLY [facility] & [facility])  Result Date: 07/02/2021  Lower Venous DVT Study Patient Name:  JAMEER STORIE [facility]  Date of Exam:   07/02/2021 Medical Rec #: [id number]       Accession #:    [id number] Date of Birth: [birth year]        Patient Gender: M Patient Age:   55Y Exam Location:  [facility] Procedure:      VAS [place] LOWER EXTREMITY VENOUS (DVT) Referring Phys: [id number] Mannie Stabile --------------------------------------------------------------------------------  Indications: Edema, and Pain.  Comparison Study: No prior study Performing Technologist: Gertie Fey  MHA, RDMS, RVT, RDCS  Examination Guidelines: A complete evaluation includes B-mode imaging, spectral Doppler, color Doppler, and power Doppler as needed of all accessible portions of each vessel. Bilateral testing is considered an integral part of a complete examination. Limited examinations for reoccurring indications may be performed as noted. The reflux portion of the exam is performed with the patient in reverse Trendelenburg.  +---------+---------------+---------+-----------+----------+--------------+ RIGHT    CompressibilityPhasicitySpontaneityPropertiesThrombus Aging +---------+---------------+---------+-----------+----------+--------------+ CFV      Full           Yes      Yes                                 +---------+---------------+---------+-----------+----------+--------------+ SFJ      Full                                                        +---------+---------------+---------+-----------+----------+--------------+ FV Prox  Full                                                        +---------+---------------+---------+-----------+----------+--------------+ FV Mid   Full                                                        +---------+---------------+---------+-----------+----------+--------------+ FV DistalFull                                                        +---------+---------------+---------+-----------+----------+--------------+ PFV      Full                                                        +---------+---------------+---------+-----------+----------+--------------+ POP      Full           Yes      Yes                                 +---------+---------------+---------+-----------+----------+--------------+  PTV      Full                                                        +---------+---------------+---------+-----------+----------+--------------+ PERO     Full                                                         +---------+---------------+---------+-----------+----------+--------------+   +---------+---------------+---------+-----------+----------+--------------+ LEFT     CompressibilityPhasicitySpontaneityPropertiesThrombus Aging +---------+---------------+---------+-----------+----------+--------------+ CFV      Full           Yes      Yes                                 +---------+---------------+---------+-----------+----------+--------------+ SFJ      Full                                                        +---------+---------------+---------+-----------+----------+--------------+ FV Prox  Full                                                        +---------+---------------+---------+-----------+----------+--------------+ FV Mid   Full                                                        +---------+---------------+---------+-----------+----------+--------------+ FV DistalFull                                                        +---------+---------------+---------+-----------+----------+--------------+ PFV      Full                                                        +---------+---------------+---------+-----------+----------+--------------+ POP      Full           Yes      Yes                                 +---------+---------------+---------+-----------+----------+--------------+ PTV      Full                                                        +---------+---------------+---------+-----------+----------+--------------+  PERO     Full                                                        +---------+---------------+---------+-----------+----------+--------------+     Summary: BILATERAL: - No evidence of deep vein thrombosis seen in the lower extremities, bilaterally. -No evidence of popliteal cyst, bilaterally.   *See table(s) above for measurements and observations. Electronically signed by Fabienne Bruns MD on 07/02/2021 at  7:48:08 PM.    Final      Discharge Exam: Vitals:   07/07/21 0210 07/07/21 0624  BP: 109/65 111/80  Pulse: 80 76  Resp: 15 13  Temp: 98.2 F (36.8 C) 98.2 F (36.8 C)  SpO2: 100% 98%   Vitals:   07/06/21 2000 07/06/21 2211 07/07/21 0210 07/07/21 0624  BP: (!) 147/94 (!) 143/74 109/65 111/80  Pulse: 81 78 80 76  Resp: Temp:  97.7 F (36.5 C) 98.2 F (36.8 C) 98.2 F (36.8 C)  TempSrc:  Oral Oral Oral  SpO2: 100% 100% 100% 98%  Weight:    56.6 kg  Height:        General: Pt is alert, awake, not in acute distress Cardiovascular: RRR, S1/S2 +, no rubs, no gallops Respiratory: CTA bilaterally, no wheezing, no rhonchi Abdominal: Soft, NT, ND, bowel sounds + Extremities: no edema, no cyanosis    The results of significant diagnostics from this hospitalization (including imaging, microbiology, ancillary and laboratory) are listed below for reference.     Microbiology: Recent Results (from the past 240 hour(s))  Resp Panel by RT-PCR (Flu A&B, Covid) Nasopharyngeal Swab     Status: None   Collection Time: 07/02/21  8:09 PM   Specimen: Nasopharyngeal Swab; Nasopharyngeal(NP) swabs in vial transport medium  Result Value Ref Range Status   SARS Coronavirus 2 by RT PCR NEGATIVE NEGATIVE Final    Comment: (NOTE) SARS-CoV-2 target nucleic acids are NOT DETECTED.  The SARS-CoV-2 RNA is generally detectable in upper respiratory specimens during the acute phase of infection. The lowest concentration of SARS-CoV-2 viral copies this assay can detect is 138 copies/mL. A negative result does not preclude SARS-Cov-2 infection and should not be used as the sole basis for treatment or other patient management decisions. A negative result may occur with  improper specimen collection/handling, submission of specimen other than nasopharyngeal swab, presence of viral mutation(s) within the areas targeted by this assay, and inadequate number of viral copies(<138 copies/mL). A  negative result must be combined with clinical observations, patient history, and epidemiological information. The expected result is Negative.  Fact Sheet for Patients:  BloggerCourse.com  Fact Sheet for Healthcare Providers:  SeriousBroker.it  This test is no t yet approved or cleared by the [place] FDA and  has been authorized for detection and/or diagnosis of SARS-CoV-2 by FDA under an Emergency Use Authorization (EUA). This EUA will remain  in effect (meaning this test can be used) for the duration of the COVID-19 declaration under Section 564(b)(1) of the Act, 21 U.S.C.section 360bbb-3(b)(1), unless the authorization is terminated  or revoked sooner.       Influenza A by PCR NEGATIVE NEGATIVE Final   Influenza B by PCR NEGATIVE NEGATIVE Final    Comment: (NOTE) The Xpert Xpress SARS-CoV-2/FLU/RSV plus assay is intended as an aid in the diagnosis  of influenza from Nasopharyngeal swab specimens and should not be used as a sole basis for treatment. Nasal washings and aspirates are unacceptable for Xpert Xpress SARS-CoV-2/FLU/RSV testing.  Fact Sheet for Patients: https://www.fda.gov/media/152166/download  Fact Sheet for Healthcare Providers: SeriousBroker.ithttps://www.fda.gov/media/152162/download  This test is not yet approved or cleared by the [place] FDA and hBloggerCourse.comas been authorized for detection and/or diagnosis of SARS-CoV-2 by FDA under an Emergency Use Authorization (EUA). This EUA will remain in effect (meaning this test can be used) for the duration of the COVID-19 declaration under Section 564(b)(1) of the Act, 21 U.S.C. section 360bbb-3(b)(1), unless the authorization is terminated or revoked.  Performed at [facility], [address] [place], [place] [address]   MRSA Next Gen by PCR, Nasal     Status: None   Collection Time: 07/03/21 10:22 AM   Specimen: Nasal Mucosa; Nasal Swab  Result Value Ref Range  Status   MRSA by PCR Next Gen NOT DETECTED NOT DETECTED Final    Comment: (NOTE) The GeneXpert MRSA Assay (FDA approved for NASAL specimens only), is one component of a comprehensive MRSA colonization surveillance program. It is not intended to diagnose MRSA infection nor to guide or monitor treatment for MRSA infections. Test performance is not FDA approved in patients less than 56 years old. Performed at [facility], [address] [place], [place] [address]   Urine Culture     Status: Abnormal   Collection Time: 07/04/21 12:30 PM   Specimen: Urine, Clean Catch  Result Value Ref Range Status   Specimen Description URINE, CLEAN CATCH  Final   Special Requests NONE  Final   Culture (A)  Final    <10,000 COLONIES/mL INSIGNIFICANT GROWTH Performed at [facility], [address] [place], [place] [address]    Report Status 07/05/2021 FINAL  Final     Labs: BNP (last 3 results) Recent Labs    07/02/21 1551  BNP 25.8   Basic Metabolic Panel: Recent Labs  Lab 07/03/21 0500 07/03/21 0735 07/04/21 0437 07/04/21 0855 07/04/21 1229 07/05/21 0136 07/05/21 1311 07/05/21 2112 07/06/21 0653 07/06/21 1422 07/06/21 2243 07/07/21 0632  NA 113*   < > 118* 119* 123* 124*   < > 126* 126* 128* 129* 130*  K 4.3   < > 5.4* 3.5 4.0 3.5  --   --  3.7  --   --   --   CL 79*   < > 90* 94* 92* 96*  --   --  95*  --   --   --   CO2 21*   < > 18* 20* 23 18*  --   --  22  --   --   --   GLUCOSE 72   < > 77 132* 95 161*  --   --  103*  --   --   --   BUN <5*   < > 13 8 19 12   --   --  15  --   --   --   CREATININE 0.50*   < > 0.56* 0.49* 0.53* 0.65  --   --  0.53*  --   --   --   CALCIUM 9.1   < > 8.8* 8.7* 9.3 8.9  --   --  9.2  --   --   --   MG 1.6*  --   --   --   --   --   --   --   --   --   --   --  PHOS 3.2  --   --   --   --   --   --   --   --   --   --   --    < > = values in this interval not displayed.   Liver Function Tests: Recent Labs  Lab 07/02/21 1933   AST 57*  ALT 29  ALKPHOS 80  BILITOT 0.8  PROT 7.7  ALBUMIN 3.6   Recent Labs  Lab 07/03/21 0052  LIPASE 31   Recent Labs  Lab 07/03/21 0052  AMMONIA 28   CBC: Recent Labs  Lab 07/02/21 1551 07/02/21 2006 07/02/21 2013 07/03/21 0500  WBC 5.3 4.6  --  2.9*  NEUTROABS 3.2 2.7  --   --   HGB 11.3* 10.6* 13.3 10.4*  HCT 32.9* 30.3* 39.0 31.4*  MCV 66.9* 65.6*  --  68.6*  PLT 280 266  --  256   Cardiac Enzymes: No results for input(s): CKTOTAL, CKMB, CKMBINDEX, TROPONINI in the last 168 hours. BNP: Invalid input(s): POCBNP CBG: Recent Labs  Lab 07/03/21 1020  GLUCAP 86   D-Dimer No results for input(s): DDIMER in the last 72 hours. Hgb A1c No results for input(s): HGBA1C in the last 72 hours. Lipid Profile No results for input(s): CHOL, HDL, LDLCALC, TRIG, CHOLHDL, LDLDIRECT in the last 72 hours. Thyroid function studies No results for input(s): TSH, T4TOTAL, T3FREE, THYROIDAB in the last 72 hours.  Invalid input(s): FREET3 Anemia work up No results for input(s): VITAMINB12, FOLATE, FERRITIN, TIBC, IRON, RETICCTPCT in the last 72 hours. Urinalysis    Component Value Date/Time   COLORURINE YELLOW 07/02/2021 2050   APPEARANCEUR CLOUDY (A) 07/02/2021 2050   LABSPEC 1.013 07/02/2021 2050   PHURINE 8.0 07/02/2021 2050   GLUCOSEU NEGATIVE 07/02/2021 2050   HGBUR NEGATIVE 07/02/2021 2050   BILIRUBINUR NEGATIVE 07/02/2021 2050   KETONESUR 5 (A) 07/02/2021 2050   PROTEINUR NEGATIVE 07/02/2021 2050   NITRITE POSITIVE (A) 07/02/2021 2050   LEUKOCYTESUR SMALL (A) 07/02/2021 2050   Sepsis Labs Invalid input(s): PROCALCITONIN,  WBC,  LACTICIDVEN Microbiology Recent Results (from the past 240 hour(s))  Resp Panel by RT-PCR (Flu A&B, Covid) Nasopharyngeal Swab     Status: None   Collection Time: 07/02/21  8:09 PM   Specimen: Nasopharyngeal Swab; Nasopharyngeal(NP) swabs in vial transport medium  Result Value Ref Range Status   SARS Coronavirus 2 by RT PCR  NEGATIVE NEGATIVE Final    Comment: (NOTE) SARS-CoV-2 target nucleic acids are NOT DETECTED.  The SARS-CoV-2 RNA is generally detectable in upper respiratory specimens during the acute phase of infection. The lowest concentration of SARS-CoV-2 viral copies this assay can detect is 138 copies/mL. A negative result does not preclude SARS-Cov-2 infection and should not be used as the sole basis for treatment or other patient management decisions. A negative result may occur with  improper specimen collection/handling, submission of specimen other than nasopharyngeal swab, presence of viral mutation(s) within the areas targeted by this assay, and inadequate number of viral copies(<138 copies/mL). A negative result must be combined with clinical observations, patient history, and epidemiological information. The expected result is Negative.  Fact Sheet for Patients:  BloggerCourse.com  Fact Sheet for Healthcare Providers:  SeriousBroker.it  This test is no t yet approved or cleared by the [place] FDA and  has been authorized for detection and/or diagnosis of SARS-CoV-2 by FDA under an Emergency Use Authorization (EUA). This EUA will remain  in effect (meaning this test can  be used) for the duration of the COVID-19 declaration under Section 564(b)(1) of the Act, 21 U.S.C.section 360bbb-3(b)(1), unless the authorization is terminated  or revoked sooner.       Influenza A by PCR NEGATIVE NEGATIVE Final   Influenza B by PCR NEGATIVE NEGATIVE Final    Comment: (NOTE) The Xpert Xpress SARS-CoV-2/FLU/RSV plus assay is intended as an aid in the diagnosis of influenza from Nasopharyngeal swab specimens and should not be used as a sole basis for treatment. Nasal washings and aspirates are unacceptable for Xpert Xpress SARS-CoV-2/FLU/RSV testing.  Fact Sheet for Patients: BloggerCourse.com  Fact Sheet for  Healthcare Providers: SeriousBroker.it  This test is not yet approved or cleared by the [place] FDA and has been authorized for detection and/or diagnosis of SARS-CoV-2 by FDA under an Emergency Use Authorization (EUA). This EUA will remain in effect (meaning this test can be used) for the duration of the COVID-19 declaration under Section 564(b)(1) of the Act, 21 U.S.C. section 360bbb-3(b)(1), unless the authorization is terminated or revoked.  Performed at [facility], [address], [place], [place] [address]   MRSA Next Gen by PCR, Nasal     Status: None   Collection Time: 07/03/21 10:22 AM   Specimen: Nasal Mucosa; Nasal Swab  Result Value Ref Range Status   MRSA by PCR Next Gen NOT DETECTED NOT DETECTED Final    Comment: (NOTE) The GeneXpert MRSA Assay (FDA approved for NASAL specimens only), is one component of a comprehensive MRSA colonization surveillance program. It is not intended to diagnose MRSA infection nor to guide or monitor treatment for MRSA infections. Test performance is not FDA approved in patients less than 51 years old. Performed at [facility], [address], [place], [place] [address]   Urine Culture     Status: Abnormal   Collection Time: 07/04/21 12:30 PM   Specimen: Urine, Clean Catch  Result Value Ref Range Status   Specimen Description URINE, CLEAN CATCH  Final   Special Requests NONE  Final   Culture (A)  Final    <10,000 COLONIES/mL INSIGNIFICANT GROWTH Performed at [facility], [address], [place], [place] [address]    Report Status 07/05/2021 FINAL  Final     Time coordinating discharge: Over 30 minutes  SIGNED:   Hughie Closs, MD  [facility] 07/07/2021, 7:37 AM  If 7PM-7AM, please contact night-coverage [email or web address]

## 2021-08-30 ENCOUNTER — Ambulatory Visit (HOSPITAL_COMMUNITY)
Admission: EM | Admit: 2021-08-30 | Discharge: 2021-08-30 | Disposition: A | Discharge status: Home / Self Care | Payer: Self-pay | Attending: Emergency Medicine | Admitting: Emergency Medicine

## 2021-08-30 ENCOUNTER — Encounter (HOSPITAL_COMMUNITY): Payer: Self-pay

## 2021-08-30 ENCOUNTER — Other Ambulatory Visit: Payer: Self-pay

## 2021-08-30 DIAGNOSIS — R220 Localized swelling, mass and lump, head: Secondary | ICD-10-CM

## 2021-08-30 MED ORDER — METHYLPREDNISOLONE SODIUM SUCC 125 MG IJ SOLR
INTRAMUSCULAR | Status: AC
  Filled 2021-08-30: qty 2

## 2021-08-30 MED ORDER — METHYLPREDNISOLONE SODIUM SUCC 125 MG IJ SOLR
60.0000 mg | Freq: Once | INTRAMUSCULAR | Status: AC
  Administered 2021-08-30: 60 mg via INTRAMUSCULAR

## 2021-08-30 MED ORDER — PREDNISONE 10 MG (21) PO TBPK: ORAL_TABLET | Freq: Every day | ORAL | 0 refills | Status: DC

## 2021-08-30 NOTE — ED Triage Notes (Signed)
Pt presents with swelling to his lips. Denies tongue or throat swelling. Denies shortness of breath. States it started out with just his top right side of his lips and is now both lips on both sides. Pt unsure what reaction is to.

## 2021-08-30 NOTE — Discharge Instructions (Signed)
Take prednisone every morning with food as prescribed on packet  At any point if you start to have shortness of breath, difficulty breathing, difficulty swallowing or feeling like your throat is closing please go to nearest emergency department for further evaluation

## 2021-08-30 NOTE — ED Provider Notes (Signed)
[facility]    CSN: [id number] Arrival date & time: 08/30/21  1419      History   Chief Complaint Chief Complaint  Patient presents with   Oral Swelling    HPI Martin Williams is a 55 y.o. male.   Patient presents with bilateral lip swelling beginning this morning upon awakening.  Initially began with just upper lip.  Denies tongue swelling, throat swelling, throat itching, difficulty swallowing, shortness of breath, difficulty breathing.  Denies changes in diet.  Had [place] fries and a hot dog last night from a new restaurant.  Has not eaten today..  Does not take any daily medication.  Denies changes in soaps, lotions, landing, detergents, facial products, Chapstick's.  Endorses that he did use a new cologne yesterday.   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 Authorizing Provider  predniSONE (STERAPRED UNI-PAK 21 TAB) 10 MG (21) TBPK tablet Take by mouth daily. Take 6 tabs by mouth daily  for 2 days, then 5 tabs for 2 days, then 4 tabs for 2 days, then 3 tabs for 2 days, 2 tabs for 2 days, then 1 tab by mouth daily for 2 days 08/30/21  Yes Traves Majchrzak R, NP  ferrous sulfate 325 (65  FE) MG tablet TAKE 1 TABLET (325 MG TOTAL) BY MOUTH DAILY. 03/16/21 03/16/22  Simmons-Robinson, Tawanna Cooler, MD  HYDROcodone-acetaminophen (NORCO/VICODIN) 5-325 MG tablet Take 1-2 tablets by mouth every 4 (four) hours as needed for moderate pain (pain score 4-6). 03/16/21   Simmons-Robinson, Tawanna Cooler, MD  methocarbamol (ROBAXIN) 500 MG tablet Take 500 mg by mouth every 6 (six) hours as needed for muscle spasms. 06/26/21   [provider]    Family History Family History  Problem Relation Age of Onset   Healthy Mother    Healthy Father     Social History Social History   Tobacco Use   Smoking status: Every Day    Packs/day: 0.50    Types: Cigarettes   Smokeless tobacco: Never  Vaping Use   Vaping Use: Never used  Substance Use Topics   Alcohol use: Yes    Comment: occ   Drug use: Not Currently     Allergies   Patient has no known allergies.   Review of Systems Review of Systems  Constitutional: Negative.   HENT: Negative.    Respiratory: Negative.    Cardiovascular: Negative.   Skin: Negative.     Physical Exam Triage Vital Signs ED Triage Vitals  Enc Vitals Group     BP 08/30/21 1530 133/83     Pulse Rate 08/30/21 1530 97     Resp 08/30/21 1530 19     Temp  08/30/21 1530 98.5 F (36.9 C)     Temp src --      SpO2 08/30/21 1530 98 %     Weight --      Height --      Head Circumference --      Peak Flow --      Pain Score 08/30/21 1528 2     Pain Loc --      Pain Edu? --      Excl. in GC? --    No data found.  Updated Vital Signs BP 133/83   Pulse 97   Temp 98.5 F (36.9 C)   Resp 19   SpO2 98%   Visual Acuity Right Eye Distance:   Left Eye Distance:   Bilateral Distance:    Right Eye Near:   Left Eye Near:    Bilateral Near:     Physical Exam Constitutional:      Appearance: Normal appearance. He is normal weight.  HENT:     Head: Normocephalic.     Comments: Severe upper lip swelling with moderate lower lip swelling, no tongue  swelling, poor dentition throughout mouth, no tonsillar adenopathy ,no cervical adenopathy Eyes:     Extraocular Movements: Extraocular movements intact.  Skin:    General: Skin is warm and dry.  Neurological:     Mental Status: He is alert and oriented to person, place, and time. Mental status is at baseline.  Psychiatric:        Mood and Affect: Mood normal.        Behavior: Behavior normal.     UC Treatments / Results  Labs (all labs ordered are listed, but only abnormal results are displayed) Labs Reviewed - No data to display  EKG   Radiology No results found.  Procedures Procedures (including critical care time)  Medications Ordered in UC Medications  methylPREDNISolone sodium succinate (SOLU-MEDROL) 125 mg/2 mL injection 60 mg (has no administration in time range)    Initial Impression / Assessment and Plan / UC Course  I have reviewed the triage vital signs and the nursing notes.  Pertinent labs & imaging results that were available during my care of the patient were reviewed by me and considered in my medical decision making (see chart for details).  Swelling of both lips  1.  Methylprednisolone 60 mg IM now 2.  Prednisone 60 mg taper 3.  Strict return precautions given to go to nearest emergency department for signs and symptoms of difficulty breathing Final Clinical Impressions(s) / UC Diagnoses   Final diagnoses:  Swelling of both lips     Discharge Instructions      Take prednisone every morning with food as prescribed on packet  At any point if you start to have shortness of breath, difficulty breathing, difficulty swallowing or feeling like your throat is closing please go to nearest emergency department for further evaluation   ED Prescriptions     Medication Sig Dispense Auth. Provider   predniSONE (STERAPRED UNI-PAK 21 TAB) 10 MG (21) TBPK tablet Take by mouth daily. Take 6 tabs by mouth daily  for 2 days, then 5 tabs for 2 days, then 4  tabs for 2 days, then 3 tabs for 2 days, 2 tabs for 2 days, then 1 tab by mouth daily for 2 days 42 tablet Marnette Perkins, Elita Boone, NP      PDMP not reviewed this encounter.   Valinda Hoar, NP 08/30/21 1600

## 2022-03-27 IMAGING — DX DG FEMUR 2+V*R*
4 series · 4 of 4 positions shown · non-contrast
Comparison: 03/11/2021, 03/10/2021

CLINICAL DATA: Right leg and pelvic pain post fall surgery in [REDACTED]

EXAM:
RIGHT FEMUR 2 VIEWS

[t femur proximal ap right]
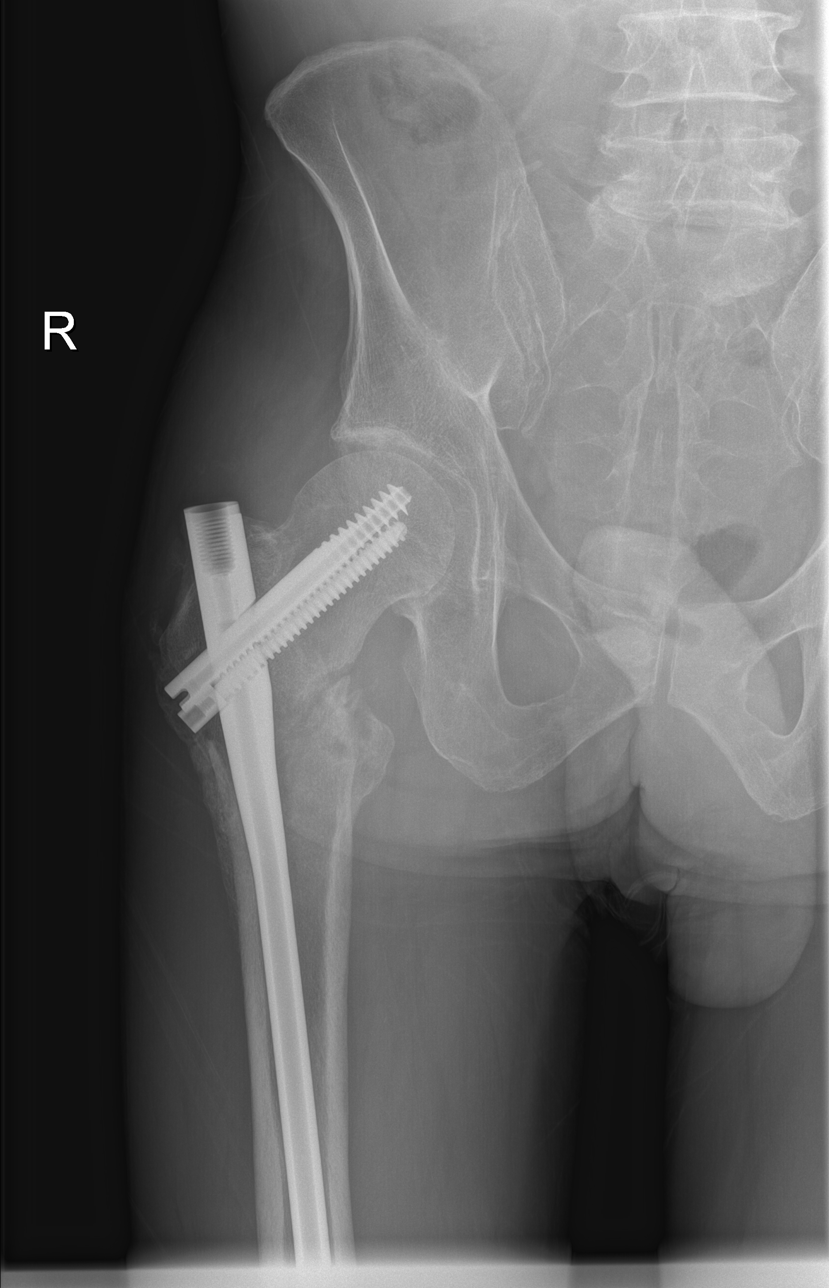

[t femur distal ap right]
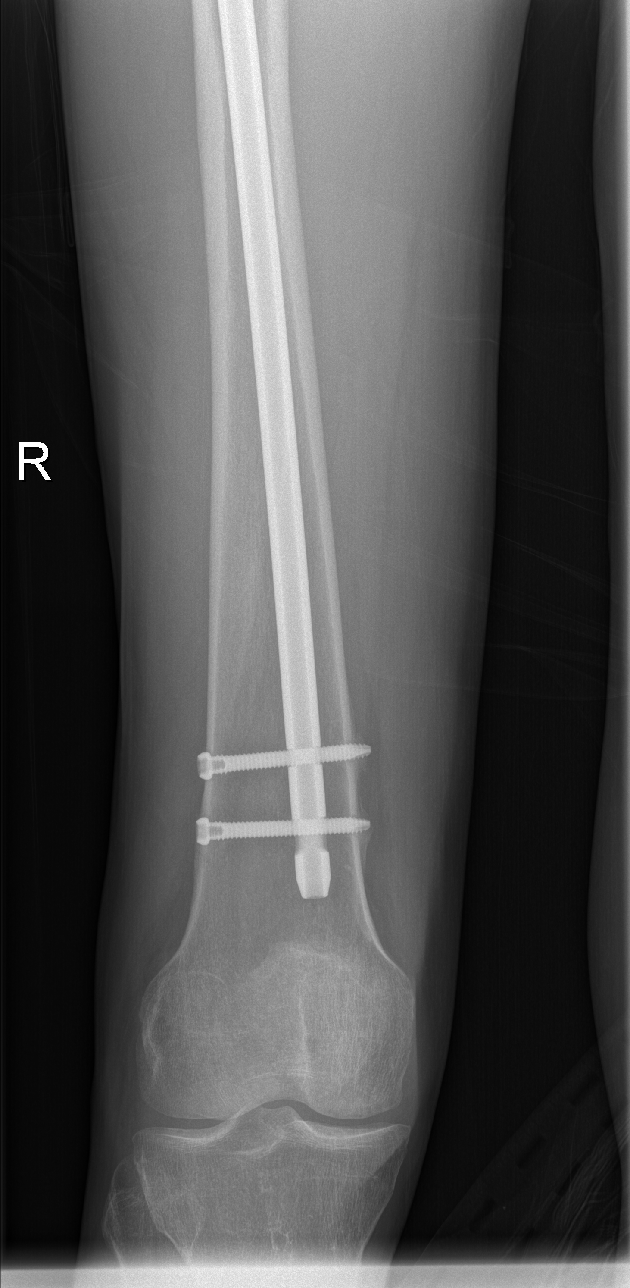

[t femur proximal lat right]
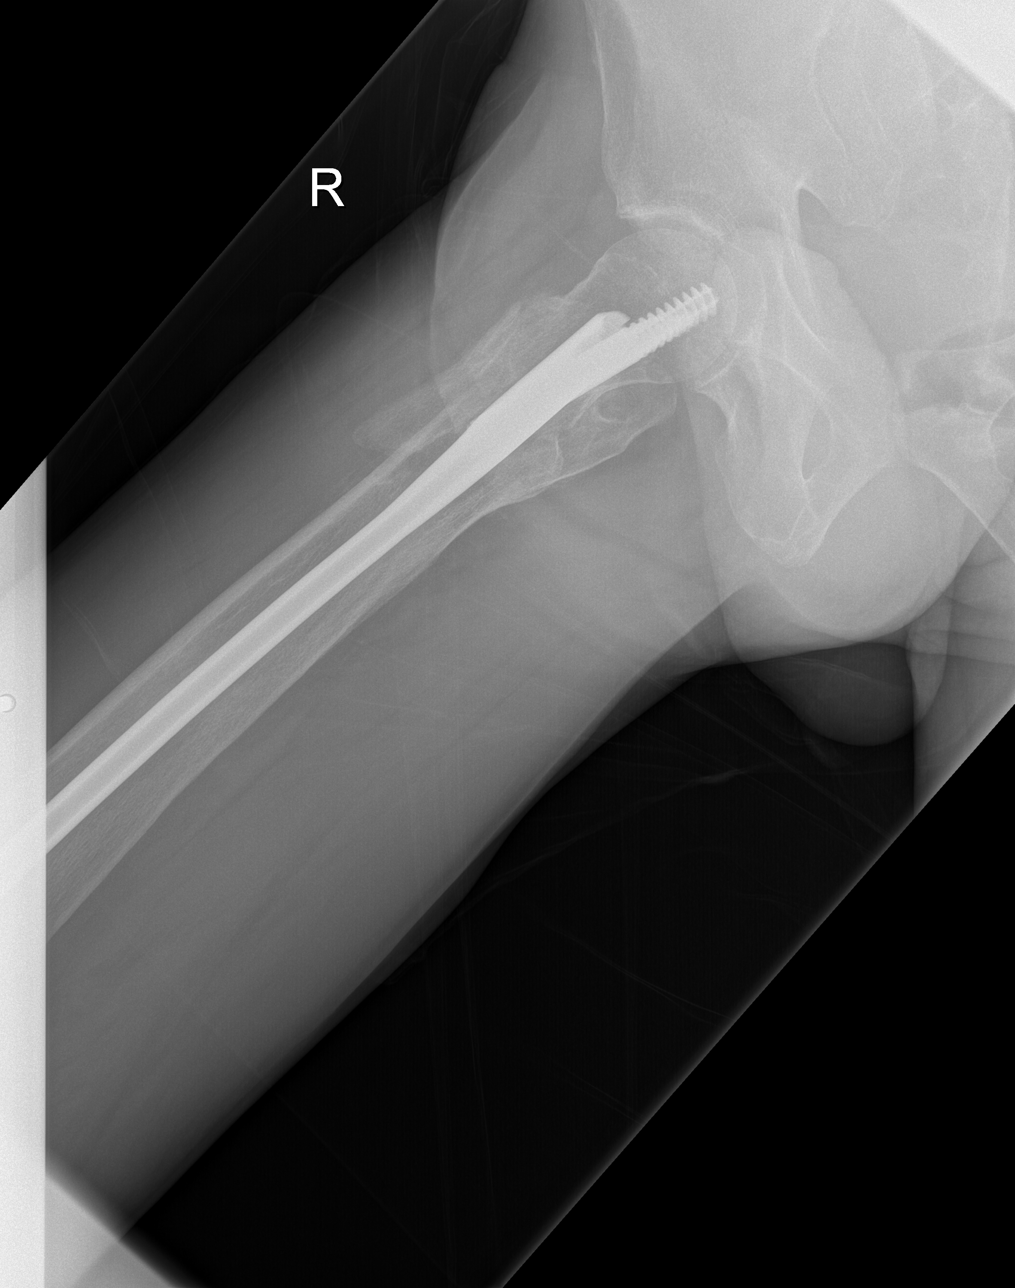

[x femur distal lat right]
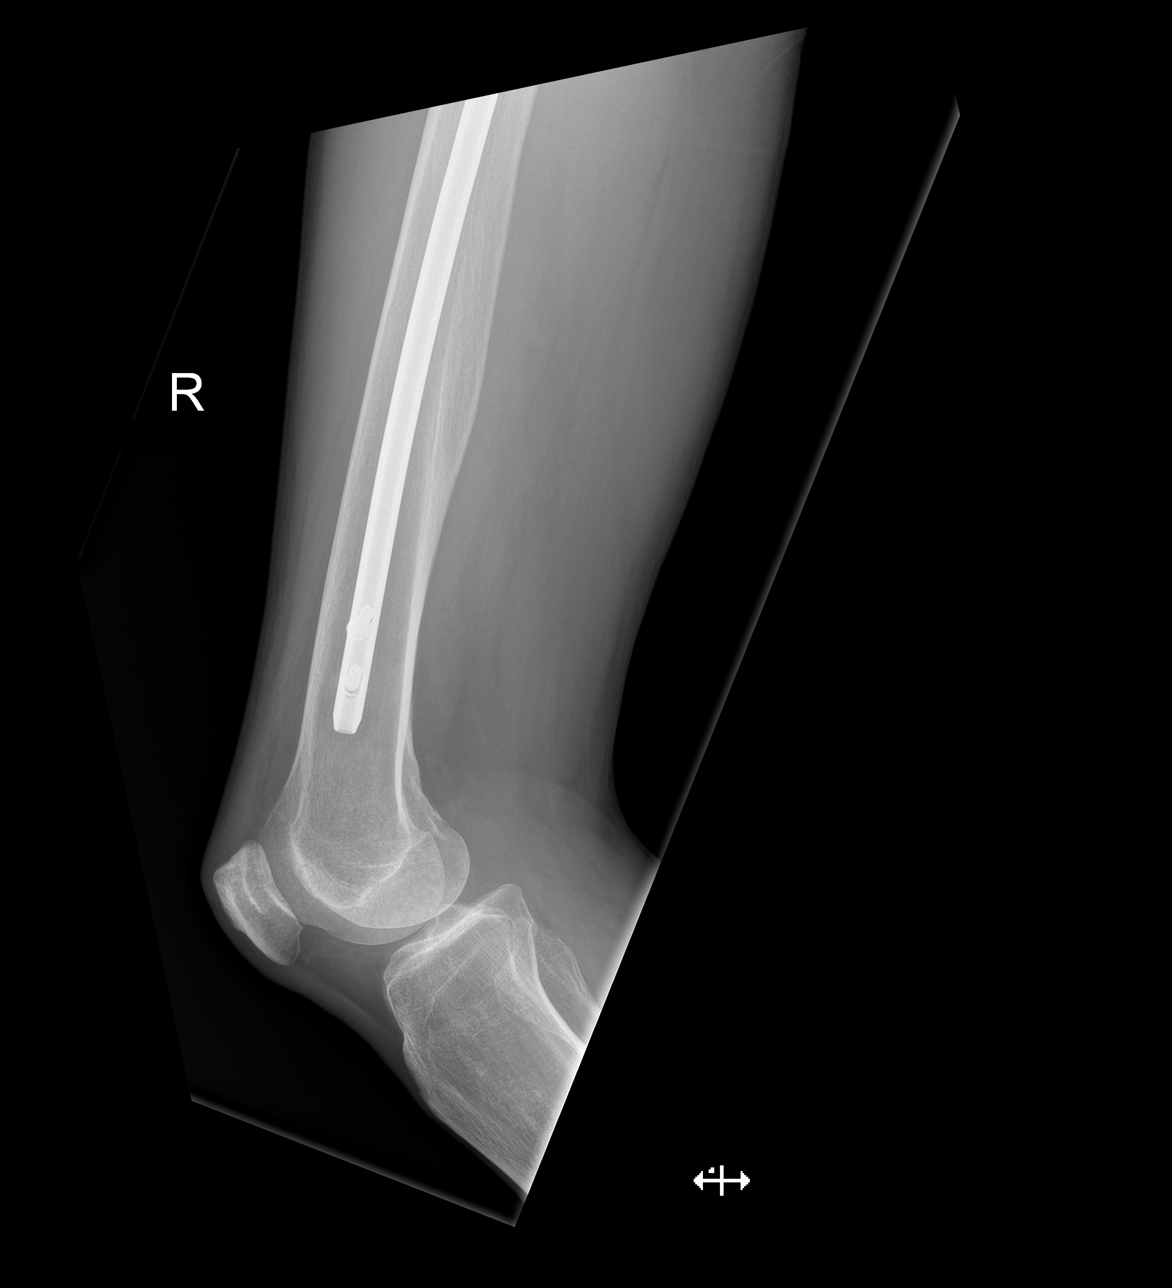

[4 of 4 positions shown; findings below may reference images not displayed]

FINDINGS: Status post intramedullary rodding and distal screw fixation of
right intertrochanteric fracture with stable appearance of hardware
and some interval callus formation. Mild periosteal new bone
formation at the distal screw tips. No acute fracture is seen
IMPRESSION: Status post intramedullary rodding of right intertrochanteric
fracture with grossly stable alignment and some interval callus. No
definite acute osseous abnormality

## 2022-03-27 IMAGING — DX DG PELVIS 1-2V
1 series · 1 of 1 positions shown · non-contrast
Comparison: 03/11/2021

CLINICAL DATA: Pelvic pain post fall

EXAM:
PELVIS - 1-2 VIEW

[t pelvis ap]
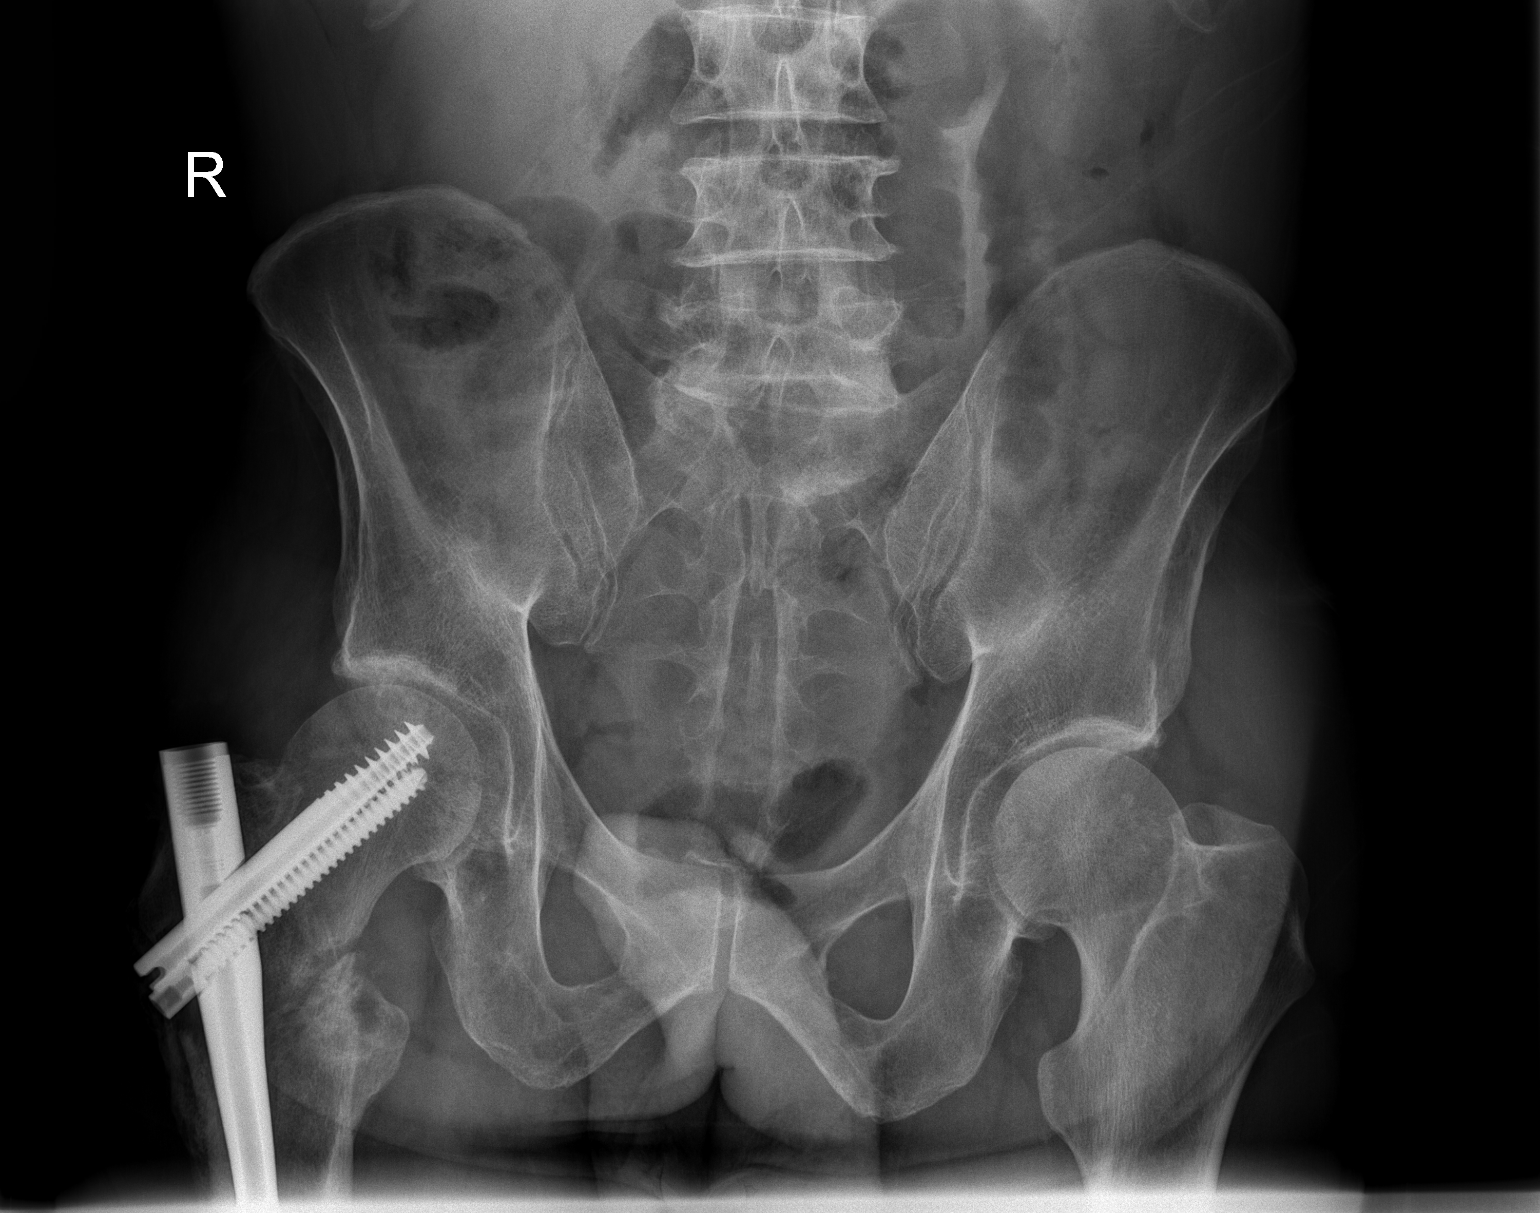

[1 of 1 positions shown; findings below may reference images not displayed]

FINDINGS: SI joints are non widened. Pubic symphysis and rami are intact.
Interval operative fixation of right intertrochanteric fracture with
stable alignment.
IMPRESSION: Operative fixation of right femur fracture. No acute osseous
abnormality

## 2022-06-14 ENCOUNTER — Other Ambulatory Visit: Payer: Self-pay

## 2022-06-14 ENCOUNTER — Ambulatory Visit (HOSPITAL_COMMUNITY)
Admission: EM | Admit: 2022-06-14 | Discharge: 2022-06-14 | Disposition: A | Discharge status: Home / Self Care | Payer: Self-pay | Attending: Emergency Medicine | Admitting: Emergency Medicine

## 2022-06-14 ENCOUNTER — Encounter (HOSPITAL_COMMUNITY): Payer: Self-pay | Admitting: *Deleted

## 2022-06-14 ENCOUNTER — Ambulatory Visit (INDEPENDENT_AMBULATORY_CARE_PROVIDER_SITE_OTHER): Payer: Self-pay

## 2022-06-14 DIAGNOSIS — R600 Localized edema: Secondary | ICD-10-CM

## 2022-06-14 DIAGNOSIS — R609 Edema, unspecified: Secondary | ICD-10-CM

## 2022-06-14 LAB — CBC WITH DIFFERENTIAL/PLATELET
Abs Immature Granulocytes: 0 10*3/uL (ref 0.00–0.07)
Basophils Absolute: 0.2 10*3/uL — ABNORMAL HIGH (ref 0.0–0.1)
Basophils Relative: 3 %
Eosinophils Absolute: 0.1 10*3/uL (ref 0.0–0.5)
Eosinophils Relative: 2 %
HCT: 33.3 % — ABNORMAL LOW (ref 39.0–52.0)
Hemoglobin: 11.5 g/dL — ABNORMAL LOW (ref 13.0–17.0)
Lymphocytes Relative: 21 %
Lymphs Abs: 1.3 10*3/uL (ref 0.7–4.0)
MCH: 23.7 pg — ABNORMAL LOW (ref 26.0–34.0)
MCHC: 34.5 g/dL (ref 30.0–36.0)
MCV: 68.5 fL — ABNORMAL LOW (ref 80.0–100.0)
Monocytes Absolute: 0.2 10*3/uL (ref 0.1–1.0)
Monocytes Relative: 4 %
Neutro Abs: 4.2 10*3/uL (ref 1.7–7.7)
Neutrophils Relative %: 70 %
Platelets: 189 10*3/uL (ref 150–400)
RBC: 4.86 MIL/uL (ref 4.22–5.81)
RDW: 16.3 % — ABNORMAL HIGH (ref 11.5–15.5)
WBC: 6 10*3/uL (ref 4.0–10.5)
nRBC: 0 % (ref 0.0–0.2)
nRBC: 0 /100 WBC

## 2022-06-14 LAB — COMPREHENSIVE METABOLIC PANEL
ALT: 53 U/L — ABNORMAL HIGH (ref 0–44)
AST: 90 U/L — ABNORMAL HIGH (ref 15–41)
Albumin: 4.1 g/dL (ref 3.5–5.0)
Alkaline Phosphatase: 82 U/L (ref 38–126)
Anion gap: 12 (ref 5–15)
BUN: <5 mg/dL — ABNORMAL LOW (ref 6–20)
CO2: 25 mmol/L (ref 22–32)
Calcium: 9.4 mg/dL (ref 8.9–10.3)
Chloride: 89 mmol/L — ABNORMAL LOW (ref 98–111)
Creatinine, Ser: 0.77 mg/dL (ref 0.61–1.24)
GFR, Estimated: >60 mL/min (ref >60)
Glucose, Bld: 73 mg/dL (ref 70–99)
Potassium: 4.5 mmol/L (ref 3.5–5.1)
Sodium: 126 mmol/L — ABNORMAL LOW (ref 135–145)
Total Bilirubin: 0.8 mg/dL (ref 0.3–1.2)
Total Protein: 8.1 g/dL (ref 6.5–8.1)

## 2022-06-14 LAB — BRAIN NATRIURETIC PEPTIDE: B Natriuretic Peptide: 10.8 pg/mL (ref 0.0–100.0)

## 2022-06-14 MED ORDER — FUROSEMIDE 40 MG PO TABS: 40.0000 mg | ORAL_TABLET | Freq: Once | ORAL | Status: DC

## 2022-06-14 MED ORDER — FUROSEMIDE 40 MG PO TABS
ORAL_TABLET | ORAL | Status: AC
  Filled 2022-06-14: qty 1

## 2022-06-14 MED ORDER — FUROSEMIDE 20 MG PO TABS: 20.0000 mg | ORAL_TABLET | Freq: Every day | ORAL | 1 refills | Status: AC

## 2022-06-14 NOTE — ED Provider Notes (Signed)
[facility]    CSN: [id number] Arrival date & time: 06/14/22  1025     History   Chief Complaint Chief Complaint  Patient presents with   Leg Swelling    HPI Martin Williams is a 56 y.o. male.   Presents with 3-day history of bilateral leg swelling.  Right leg worse than right.  He has pain and pressure in both legs.  Pain with walking.  Denies any injury, trauma, recent bites.  Has not tried anything for symptoms. No cardiac history.  Denies any shortness of breath, chest pain, dizziness.  No fevers.  No recent long car or plane rides, no recent surgery, no recent immobilization.  Patient is a 1 to 2 pack a day smoker.  No testosterone use.  History of right hip fracture s/p intertrochanteric IM nail placement. History of hyponatremia for which he was hospitalized in July of last year. 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  Past Medical History:  Diagnosis Date   Tobacco abuse     Patient Active Problem List   Diagnosis Date Noted   Pressure injury of skin 07/04/2021   Protein-calorie malnutrition, severe 07/04/2021   Hyponatremia 07/02/2021   Hypertension 07/02/2021   UTI (urinary tract infection) 07/02/2021   Delirium tremens (HCC) 07/02/2021   Primary polydipsia 07/02/2021   Fall 03/11/2021   Hip fx (HCC) 03/10/2021    Past Surgical History:  Procedure Laterality Date   INTRAMEDULLARY (IM) NAIL INTERTROCHANTERIC Right 03/11/2021   Procedure: INTRAMEDULLARY (IM) NAIL INTERTROCHANTRIC;  Surgeon: Roby Lofts, MD;  Location: [facility];  Service: Orthopedics;  Laterality: Right;   LAPAROSCOPIC GASTROTOMY W/ REPAIR OF ULCER       Home Medications    Prior to Admission medications   Medication Sig Start Date End Date Taking? Authorizing Provider  furosemide (LASIX) 20 MG tablet Take 1 tablet (20 mg total) by mouth daily. 06/14/22  Yes Evans Levee, Lurena Joiner, PA-C  ferrous sulfate 325 (65 FE) MG tablet TAKE 1 TABLET (325 MG TOTAL) BY MOUTH  DAILY. 03/16/21 03/16/22  Simmons-Robinson, Tawanna Cooler, MD    Family History Family History  Problem Relation Age of Onset   Healthy Mother    Healthy Father     Social History Social History   Tobacco Use   Smoking status: Every Day    Packs/day: 0.50    Types: Cigarettes   Smokeless tobacco: Never  Vaping Use   Vaping Use: Never used  Substance Use Topics   Alcohol use: Yes    Comment: occ   Drug use: Not Currently     Allergies   Patient has no known allergies.   Review of Systems Review of Systems  Per HPI  Physical Exam Triage Vital Signs ED Triage Vitals  Enc Vitals Group     BP 06/14/22 1111 (!) 143/88     Pulse Rate 06/14/22 1111 70     Resp 06/14/22 1111 18     Temp 06/14/22 1111 98.9 F (37.2 C)     Temp src --      SpO2 06/14/22 1111 96 %     Weight --      Height --      Head Circumference --      Peak Flow --      Pain Score 06/14/22 1109 4     Pain Loc --      Pain Edu? --      Excl. in GC? --    No  data found.  Updated Vital Signs BP (!) 143/88   Pulse 70   Temp 98.9 F (37.2 C)   Resp 18   SpO2 96%    Physical Exam Vitals and nursing note reviewed.  Constitutional:      General: He is not in acute distress.    Appearance: Normal appearance.  HENT:     Mouth/Throat:     Pharynx: Oropharynx is clear.  Eyes:     Conjunctiva/sclera: Conjunctivae normal.  Cardiovascular:     Rate and Rhythm: Normal rate and regular rhythm.     Heart sounds: Murmur heard.     Comments: Pitting edema bilateral lower extremities, extends to knees.  2 cm difference between ankle swelling (R > L).  Pulses and sensation intact. Pulmonary:     Effort: Pulmonary effort is normal. No respiratory distress.     Breath sounds: Decreased air movement present. Examination of the right-lower field reveals rales. Rales present.     Comments: Mild rales right lower lobe, rest of lungs clear but decreased air movement Musculoskeletal:     Cervical back: Normal  range of motion.     Right lower leg: Tenderness present. 3+ Pitting Edema present.     Left lower leg: Tenderness present. 2+ Pitting Edema present.     Right ankle: Swelling present. Tenderness present.     Left ankle: Swelling present. Tenderness present.     Comments: Pain with palpation of bilateral lower legs including calfs.  Moderate swelling noted in left ankle, greater swelling in right ankle.  Decreased range of motion of bilateral ankles due to the swelling.  Neurological:     Mental Status: He is alert and oriented to person, place, and time.     Gait: Gait is intact.     UC Treatments / Results  Labs (all labs ordered are listed, but only abnormal results are displayed) Labs Reviewed  CBC WITH DIFFERENTIAL/PLATELET  COMPREHENSIVE METABOLIC PANEL  BRAIN NATRIURETIC PEPTIDE    EKG  Radiology DG Chest 2 View  Result Date: 06/14/2022 CLINICAL DATA:  Pitting lower extremity edema. EXAM: CHEST - 2 VIEW COMPARISON:  Radiographs 07/02/2021 and 03/10/2021 FINDINGS: The heart size and mediastinal contours are stable. The lungs are hyperinflated but clear. There is no edema, pleural effusion or pneumothorax. No acute osseous findings are evident. IMPRESSION: No evidence of acute cardiopulmonary process. Probable underlying chronic obstructive pulmonary disease. Electronically Signed   By: Carey Bullocks M.D.   On: 06/14/2022 11:54    Procedures Procedures   Medications Ordered in UC Medications - No data to display   Initial Impression / Assessment and Plan / UC Course  I have reviewed the triage vital signs and the nursing notes.  Pertinent labs & imaging results that were available during my care of the patient were reviewed by me and considered in my medical decision making (see chart for details).  Vital signs are stable, patient is well-appearing and denies any shortness of breath or trouble breathing.  Oxygen sats 96%. Afebrile, no tachycardia.  Chest x-ray no  acute cardiopulmonary disease. Radiology report reads probable underlying COPD.  CMP, CBC, BNP pending.  Declined lasix dose in clinc. Can try daily Lasix 20 mg for leg swelling/fluid retention. Recommend compression socks, elevation.  Added to PCP assistance list. Decrease cigarette use. Understands to go to the emergency department if symptoms worsen. Return precautions discussed. Patient agrees to plan and is discharged in stable condition.  Final Clinical Impressions(s) / UC Diagnoses  Final diagnoses:  Bilateral lower extremity edema     Discharge Instructions      Please take the Lasix 20 mg daily. This medicine may increase your frequency of urination. I recommend compression socks and elevating your legs.  We will call you if your blood work returns abnormal.  Someone will reach out to you regarding setting up with a primary care provider.  Please go to the emergency department if symptoms worsen.      ED Prescriptions     Medication Sig Dispense Auth. Provider   furosemide (LASIX) 20 MG tablet Take 1 tablet (20 mg total) by mouth daily. 20 tablet Chane Cowden, Wells Guiles, PA-C      PDMP not reviewed this encounter.   Sueellen Kayes, Vernice Jefferson 06/14/22 1249

## 2022-06-14 NOTE — ED Triage Notes (Signed)
T presents with swelling to bil lower legs limited leg worse than right. Pt limping as he was walking down hall.

## 2022-06-14 NOTE — Discharge Instructions (Addendum)
Please take the Lasix 20 mg daily. This medicine may increase your frequency of urination. I recommend compression socks and elevating your legs.  We will call you if your blood work returns abnormal.  Someone will reach out to you regarding setting up with a primary care provider.  Please go to the emergency department if symptoms worsen.

## 2022-08-12 ENCOUNTER — Ambulatory Visit: Payer: Self-pay

## 2022-08-12 NOTE — Telephone Encounter (Signed)
Pt and sister # called, unable to reach on both numbers or LVM. Will try again later.   Summary: Swolen legs and black spot on leg    I have Gavin Pound the patients sister calling stating both of his legs are swollen. He was seen at [facility] and given fluid pills which helped temporarily but now they are swollen again. This all started after he fell and injured his hip. He also has a black spot on his leg. Please assist patient further as he is not a patient yet and the schedule is booked out a ways.

## 2022-08-12 NOTE — Telephone Encounter (Signed)
Patient called on number listed, recording call could not be completed at this time; hang up and try the call again later. Gavin Pound (sister) called on number listed, ringing no answer, no voicemail, recording person not available.    Summary: Swolen legs and black spot on leg   I have Gavin Pound the patients sister calling stating both of his legs are swollen. He was seen at [facility] and given fluid pills which helped temporarily but now they are swollen again. This all started after he fell and injured his hip. He also has a black spot on his leg. Please assist patient further as he is not a patient yet and the schedule is booked out a ways.

## 2022-08-12 NOTE — Telephone Encounter (Signed)
  Chief Complaint: leg swelling Symptoms: bilat LE swelling up to knees, RLE discolored black, redness and sores to LE Frequency: ongoing for 1 month > Pertinent Negatives: NA Disposition: [] ED /[] Urgent Care (no appt availability in office) / [] Appointment(In office/virtual)/ []  [facility]/ [] Home Care/ [] Refused Recommended Disposition /[x] [place] [facility]/ []  Follow-up with PCP Additional Notes: pt went to UC on 06/14/22, was given furosemide and helped with swelling but now swelling has come back and worse. Spoke with sister and she states he is needing to be seen. Since pt doesn't have PCP advised mobile unit for tomorrow 08/13/22 or can go to UC. She states she will take him to Mobile unit tomorrow morning. Also scheduled pt for NPA on 10/31/22 with Dr. PCE.   Reason for Disposition  [1] MODERATE leg swelling (e.g., swelling extends up to knees) AND [2] new-onset or worsening  Answer Assessment - Initial Assessment Questions 1. ONSET: "When did the swelling start?" (e.g., minutes, hours, days)     Ongoing for 1 month  2. LOCATION: "What part of the leg is swollen?"  "Are both legs swollen or just one leg?"     Bilat LE, right LE worse and turning black colored  3. SEVERITY: "How bad is the swelling?" (e.g., localized; mild, moderate, severe)   - Localized: Small area of swelling localized to one leg.   - MILD pedal edema: Swelling limited to foot and ankle, pitting edema < 1/4 inch (6 mm) deep, rest and elevation eliminate most or all swelling.   - MODERATE edema: Swelling of lower leg to knee, pitting edema > 1/4 inch (6 mm) deep, rest and elevation only partially reduce swelling.   - SEVERE edema: Swelling extends above knee, facial or hand swelling present.      Moderate  4. REDNESS: "Does the swelling look red or infected?"     yes 5. PAIN: "Is the swelling painful to touch?" If Yes, ask: "How painful is it?"   (Scale 1-10; mild, moderate or severe)      7/8 10. OTHER SYMPTOMS: "Do you have any other symptoms?" (e.g., chest pain, difficulty breathing)  Protocols used: Leg Swelling and Edema-A-AH

## 2022-10-30 NOTE — Progress Notes (Unsigned)
  Subjective:    Martin Williams - 56 y.o. male MRN [id number]  Date of birth: [birth year]  HPI  Martin Williams is a 56 year-old male to establish care.   Current issues and/or concerns:  ROS per HPI     Health Maintenance:  Health Maintenance Due  Topic Date Due   COVID-19 Vaccine (1) Never done   Hepatitis C Screening  Never done   COLONOSCOPY (Pts 45-73yrs Insurance coverage will need to be confirmed)  Never done   Zoster Vaccines- Shingrix (1 of 2) Never done   INFLUENZA VACCINE  Never done     Past Medical History: Patient Active Problem List   Diagnosis Date Noted   Pressure injury of skin 07/04/2021   Protein-calorie malnutrition, severe 07/04/2021   Hyponatremia 07/02/2021   Hypertension 07/02/2021   UTI (urinary tract infection) 07/02/2021   Delirium tremens (HCC) 07/02/2021   Primary polydipsia 07/02/2021   Fall 03/11/2021   Hip fx (HCC) 03/10/2021      Social History   reports that he has been smoking cigarettes. He has been smoking an average of .5 packs per day. He has never used smokeless tobacco. He reports current alcohol use. He reports that he does not currently use drugs.   Family History  family history includes Healthy in his father and mother.   Medications: reviewed and updated   Objective:   Physical Exam There were no vitals taken for this visit. Physical Exam      Assessment & Plan:         Patient was given clear instructions to go to Emergency Department or return to medical center if symptoms don't improve, worsen, or new problems develop.The patient verbalized understanding.  I discussed the assessment and treatment plan with the patient. The patient was provided an opportunity to ask questions and all were answered. The patient agreed with the plan and demonstrated an understanding of the instructions.   The patient was advised to call back or seek an in-person evaluation if the symptoms worsen or if the condition fails to  improve as anticipated.    Ricky Stabs, NP 10/30/2022, 8:04 AM Primary Care at [facility]

## 2022-10-31 ENCOUNTER — Encounter: Payer: Self-pay | Admitting: Family

## 2022-10-31 DIAGNOSIS — Z7689 Persons encountering health services in other specified circumstances: Secondary | ICD-10-CM

## 2024-10-20 ENCOUNTER — Other Ambulatory Visit (HOSPITAL_COMMUNITY): Payer: Self-pay

## 2024-10-20 ENCOUNTER — Encounter (HOSPITAL_COMMUNITY): Payer: Self-pay

## 2024-10-20 ENCOUNTER — Ambulatory Visit (HOSPITAL_COMMUNITY)
Admission: EM | Admit: 2024-10-20 | Discharge: 2024-10-20 | Disposition: A | Discharge status: Home / Self Care | Payer: Self-pay | Attending: Internal Medicine | Admitting: Internal Medicine

## 2024-10-20 DIAGNOSIS — M25551 Pain in right hip: Secondary | ICD-10-CM

## 2024-10-20 MED ORDER — METHYLPREDNISOLONE 4 MG PO TBPK
ORAL_TABLET | ORAL | 0 refills | Status: AC
  Filled 2024-10-20: qty 21, 6d supply, fill #0

## 2024-10-20 MED ORDER — METHYLPREDNISOLONE 4 MG PO TBPK: ORAL_TABLET | ORAL | 0 refills | Status: DC

## 2024-10-20 NOTE — ED Triage Notes (Signed)
 Pt states right hip pain that radiates down his right leg and foot for the past 6 days. State he has a rod in his right hip.  States he has been taking tylenol  and OTC pain relief meds at home for the pain with no relief.  Pt ambulating with a limp using a walking stick.

## 2024-10-20 NOTE — Discharge Instructions (Signed)
 I am concerned you have some inflammation along the outer part of your hip. I prescribed a steroid pack to help with pain / reducing inflammation. Please follow up with your orthopedic doctors if pain does not improve.  OK to return to work on Monday 10/25/24.

## 2024-10-20 NOTE — ED Provider Notes (Signed)
 [facility]    CSN: [id number] Arrival date & time: 10/20/24  9094      History   Chief Complaint Chief Complaint  Patient presents with   Hip Pain    HPI Martin Williams is a 58 y.o. male who presents urgent care endorsing a 6-day history of right lateral hip pain.  His past surgical history is significant for intramedullary rodding and distal screw fixation of a right intertrochanteric fracture in March 2022.  He denies any specific injury or trauma 6 days ago preceding the onset of pain.  Pain is worse with ambulation.  He additionally endorses night pain and is unable to sleep on his right side.  He has tried taking Tylenol  and NSAIDs for pain without sustained relief.  He states that he has  been out of work all week due to his discomfort.  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 Authorizing Provider  ferrous sulfate  325 (65 FE) MG tablet TAKE 1 TABLET (325 MG TOTAL) BY MOUTH DAILY. 03/16/21 03/16/22  Simmons-Robinson, Rockie, MD  furosemide  (LASIX ) 20 MG tablet Take 1 tablet (20 mg total) by mouth daily. 06/14/22   Rising, Asberry, PA-C  methylPREDNISolone  (MEDROL  DOSEPAK) 4 MG TBPK tablet Take 6  tablets (24 mg total) by mouth daily for 1 day, THEN 5 tablets (20 mg total) daily for 1 day, THEN 4 tablets (16 mg total) daily for 1 day, THEN 3 tablets (12 mg total) daily for 1 day, THEN 2 tablets (8 mg total) daily for 1 day, THEN 1 tablet (4 mg total) daily for 1 day. 10/20/24 10/26/24  Melvenia Manus BRAVO, MD    Family History Family History  Problem Relation Age of Onset   Healthy Mother    Healthy Father     Social History Social History   Tobacco Use   Smoking status: Every Day    Current packs/day: 0.50    Types: Cigarettes   Smokeless tobacco: Never  Vaping Use   Vaping status: Never Used  Substance Use Topics   Alcohol use: Yes    Comment: occ   Drug use: Not Currently     Allergies   Patient has no known allergies.   Review of Systems Review of Systems  Musculoskeletal:        Right lateral hip pain x 6 days     Physical Exam Triage Vital Signs ED Triage Vitals  Encounter Vitals Group     BP 10/20/24 0932 121/65     Girls Systolic BP Percentile --      Girls Diastolic BP Percentile --      Boys Systolic BP Percentile --      Boys Diastolic  BP Percentile --      Pulse Rate 10/20/24 0932 75     Resp 10/20/24 0932 16     Temp 10/20/24 0932 97.6 F (36.4 C)     Temp Source 10/20/24 0932 Oral     SpO2 10/20/24 0932 98 %     Weight --      Height --      Head Circumference --      Peak Flow --      Pain Score 10/20/24 0931 8     Pain Loc --      Pain Education --      Exclude from Growth Chart --    No data found.  Updated Vital Signs BP 121/65 (BP Location: Left Arm)   Pulse 75   Temp 97.6 F (36.4 C) (Oral)   Resp 16   SpO2 98%   Physical Exam Vitals reviewed.  Constitutional:      General: He is not in acute distress.    Appearance: Normal appearance. He is not toxic-appearing.  Musculoskeletal:     Comments: No obvious deformity on inspection of the right hip.  There is TTP along the right lateral hip over the palpable prominence  from intramedullary rodding/greater trochanter.  ROM from 0-85 degrees of flexion.  Pain is elicited with resisted abduction.  Negative SLR.  Negative Stinchfield.  Skin:    General: Skin is warm and dry.  Neurological:     Mental Status: He is alert.    UC Treatments / Results  Labs (all labs ordered are listed, but only abnormal results are displayed) Labs Reviewed - No data to display  EKG   Radiology No results found.  Procedures Procedures (including critical care time)  Medications Ordered in UC Medications - No data to display  Initial Impression / Assessment and Plan / UC Course  I have reviewed the triage vital signs and the nursing notes.  Pertinent labs & imaging results that were available during my care of the patient were reviewed by me and considered in my medical decision making (see chart for details).    Patient is a 58 year old male with a history of right intramedullary rodding and distal screw fixation of a right intertrochanteric fracture in March 2022.  He presents urgent care today endorsing a 6-day history of right lateral hip pain with no inciting trauma or specific injury preceding the onset of pain.  His history and exam findings today are concerning for greater trochanteric pain syndrome.  Pain has not improved with NSAIDs and Tylenol .  Additional treatment options reviewed.  Ultimately, I prescribed a Medrol  Dosepak to help with pain relief.  I recommended that he follow-up with orthopedic surgery if pain worsens or fails to improve.  He can return to work on Monday, 10/25/2024.  He is agreeable to this plan and is medically stable for discharge at this time.  Final Clinical Impressions(s) / UC Diagnoses   Final diagnoses:  Greater trochanteric pain syndrome of right lower extremity     Discharge Instructions      I am concerned you have some inflammation along the outer part of your hip. I prescribed a steroid pack to help with pain / reducing  inflammation. Please follow up with your orthopedic doctors if pain does not improve.  OK to return to work on Monday 10/25/24.    ED Prescriptions     Medication Sig Dispense Auth. Provider   methylPREDNISolone  (MEDROL  DOSEPAK) 4 MG TBPK tablet  (Status:  Discontinued) Use as directed. 21 each Melvenia Manus BRAVO, MD   methylPREDNISolone  (MEDROL  DOSEPAK) 4 MG TBPK tablet Take 6 tablets (24 mg total) by mouth daily for 1 day, THEN 5 tablets (20 mg total) daily for 1 day, THEN 4 tablets (16 mg total) daily for 1 day, THEN 3 tablets (12 mg total) daily for 1 day, THEN 2 tablets (8 mg total) daily for 1 day, THEN 1 tablet (4 mg total) daily for 1 day. 21 tablet Ahmiya Abee E, MD      PDMP not reviewed this encounter.   Melvenia Manus BRAVO, MD 10/20/24 [phone]

## 2024-10-21 ENCOUNTER — Other Ambulatory Visit (HOSPITAL_COMMUNITY): Payer: Self-pay

## 2024-10-21 ENCOUNTER — Other Ambulatory Visit: Payer: Self-pay | Admitting: Family Medicine

## 2024-10-28 ENCOUNTER — Other Ambulatory Visit (HOSPITAL_COMMUNITY): Payer: Self-pay
